# Patient Record
Sex: Male | Born: 1940 | Race: White | Hispanic: No | Marital: Married | Smoking: Former smoker
Health system: Southern US, Community
[De-identification: ages and names within clinical notes are randomized; demographics above are authoritative.]

## PROBLEM LIST (undated history)

## (undated) DIAGNOSIS — I639 Cerebral infarction, unspecified: Secondary | ICD-10-CM

## (undated) DIAGNOSIS — C61 Malignant neoplasm of prostate: Secondary | ICD-10-CM

## (undated) DIAGNOSIS — I10 Essential (primary) hypertension: Secondary | ICD-10-CM

## (undated) DIAGNOSIS — C169 Malignant neoplasm of stomach, unspecified: Secondary | ICD-10-CM

## (undated) DIAGNOSIS — C4490 Unspecified malignant neoplasm of skin, unspecified: Secondary | ICD-10-CM

## (undated) HISTORY — DX: Cerebral infarction, unspecified: I63.9

## (undated) HISTORY — PX: VASCULAR SURGERY: SHX849

## (undated) HISTORY — DX: Malignant neoplasm of stomach, unspecified: C16.9

## (undated) HISTORY — DX: Malignant neoplasm of prostate: C61

## (undated) HISTORY — DX: Unspecified malignant neoplasm of skin, unspecified: C44.90

---

## 2004-10-30 ENCOUNTER — Ambulatory Visit: Payer: Self-pay | Admitting: Surgery

## 2005-04-15 ENCOUNTER — Other Ambulatory Visit: Payer: Self-pay

## 2005-04-15 ENCOUNTER — Emergency Department: Payer: Self-pay | Admitting: Emergency Medicine

## 2007-01-12 ENCOUNTER — Ambulatory Visit: Payer: Self-pay | Admitting: Surgery

## 2007-03-24 ENCOUNTER — Ambulatory Visit: Payer: Self-pay | Admitting: Vascular Surgery

## 2007-04-14 ENCOUNTER — Ambulatory Visit: Payer: Self-pay | Admitting: Vascular Surgery

## 2011-04-15 ENCOUNTER — Emergency Department: Payer: Self-pay | Admitting: Emergency Medicine

## 2011-04-17 ENCOUNTER — Ambulatory Visit: Payer: Self-pay | Admitting: Family Medicine

## 2011-09-27 ENCOUNTER — Ambulatory Visit: Payer: Self-pay | Admitting: Gastroenterology

## 2011-12-03 ENCOUNTER — Ambulatory Visit: Payer: Self-pay | Admitting: Unknown Physician Specialty

## 2016-04-08 ENCOUNTER — Other Ambulatory Visit: Payer: Self-pay | Admitting: Vascular Surgery

## 2016-04-08 DIAGNOSIS — R1901 Right upper quadrant abdominal swelling, mass and lump: Secondary | ICD-10-CM

## 2016-04-15 ENCOUNTER — Ambulatory Visit
Admission: RE | Admit: 2016-04-15 | Discharge: 2016-04-15 | Disposition: A | Discharge status: Home / Self Care | Payer: Medicare Other | Source: Ambulatory Visit | Attending: Vascular Surgery | Admitting: Vascular Surgery

## 2016-04-15 DIAGNOSIS — J439 Emphysema, unspecified: Secondary | ICD-10-CM | POA: Diagnosis not present

## 2016-04-15 DIAGNOSIS — R160 Hepatomegaly, not elsewhere classified: Secondary | ICD-10-CM | POA: Diagnosis not present

## 2016-04-15 DIAGNOSIS — K7689 Other specified diseases of liver: Secondary | ICD-10-CM | POA: Insufficient documentation

## 2016-04-15 DIAGNOSIS — R1901 Right upper quadrant abdominal swelling, mass and lump: Secondary | ICD-10-CM | POA: Diagnosis present

## 2016-04-15 DIAGNOSIS — C7971 Secondary malignant neoplasm of right adrenal gland: Secondary | ICD-10-CM | POA: Insufficient documentation

## 2016-04-15 DIAGNOSIS — C7972 Secondary malignant neoplasm of left adrenal gland: Secondary | ICD-10-CM | POA: Diagnosis not present

## 2016-04-15 HISTORY — DX: Essential (primary) hypertension: I10

## 2016-04-15 LAB — POCT I-STAT CREATININE: Creatinine, Ser: 1.1 mg/dL (ref 0.61–1.24)

## 2016-04-15 MED ORDER — IOPAMIDOL (ISOVUE-300) INJECTION 61%
100.0000 mL | Freq: Once | INTRAVENOUS | Status: AC | PRN
  Administered 2016-04-15: 100 mL via INTRAVENOUS

## 2016-04-22 DIAGNOSIS — R160 Hepatomegaly, not elsewhere classified: Secondary | ICD-10-CM | POA: Insufficient documentation

## 2016-04-23 ENCOUNTER — Inpatient Hospital Stay: Payer: Medicare Other | Attending: Oncology | Admitting: Oncology

## 2016-04-23 ENCOUNTER — Encounter (INDEPENDENT_AMBULATORY_CARE_PROVIDER_SITE_OTHER): Payer: Self-pay

## 2016-04-23 ENCOUNTER — Inpatient Hospital Stay: Payer: Medicare Other

## 2016-04-23 ENCOUNTER — Encounter: Payer: Self-pay | Admitting: Oncology

## 2016-04-23 ENCOUNTER — Inpatient Hospital Stay: Payer: Medicare Other | Admitting: Oncology

## 2016-04-23 VITALS — BP 145/71 | HR 65 | Temp 97.1°F | Resp 18 | Ht 68.0 in | Wt 150.6 lb

## 2016-04-23 DIAGNOSIS — I10 Essential (primary) hypertension: Secondary | ICD-10-CM | POA: Insufficient documentation

## 2016-04-23 DIAGNOSIS — R160 Hepatomegaly, not elsewhere classified: Secondary | ICD-10-CM | POA: Diagnosis not present

## 2016-04-23 DIAGNOSIS — Z79899 Other long term (current) drug therapy: Secondary | ICD-10-CM | POA: Insufficient documentation

## 2016-04-23 DIAGNOSIS — Z8546 Personal history of malignant neoplasm of prostate: Secondary | ICD-10-CM | POA: Diagnosis not present

## 2016-04-23 DIAGNOSIS — R978 Other abnormal tumor markers: Secondary | ICD-10-CM | POA: Diagnosis not present

## 2016-04-23 LAB — CBC WITH DIFFERENTIAL/PLATELET
Basophils Absolute: 0.1 10*3/uL (ref 0–0.1)
Basophils Relative: 1 %
EOS PCT: 3 %
Eosinophils Absolute: 0.3 10*3/uL (ref 0–0.7)
HCT: 35.9 % — ABNORMAL LOW (ref 40.0–52.0)
HEMOGLOBIN: 12.2 g/dL — AB (ref 13.0–18.0)
LYMPHS ABS: 1.9 10*3/uL (ref 1.0–3.6)
LYMPHS PCT: 19 %
MCH: 27.3 pg (ref 26.0–34.0)
MCHC: 34 g/dL (ref 32.0–36.0)
MCV: 80.3 fL (ref 80.0–100.0)
Monocytes Absolute: 0.8 10*3/uL (ref 0.2–1.0)
Monocytes Relative: 8 %
NEUTROS PCT: 69 %
Neutro Abs: 6.9 10*3/uL — ABNORMAL HIGH (ref 1.4–6.5)
Platelets: 507 10*3/uL — ABNORMAL HIGH (ref 150–440)
RBC: 4.47 MIL/uL (ref 4.40–5.90)
RDW: 14.4 % (ref 11.5–14.5)
WBC: 10.1 10*3/uL (ref 3.8–10.6)

## 2016-04-23 LAB — COMPREHENSIVE METABOLIC PANEL
ALK PHOS: 288 U/L — AB (ref 38–126)
ALT: 16 U/L — AB (ref 17–63)
AST: 32 U/L (ref 15–41)
Albumin: 3.5 g/dL (ref 3.5–5.0)
Anion gap: 8 (ref 5–15)
BUN: 17 mg/dL (ref 6–20)
CALCIUM: 11.4 mg/dL — AB (ref 8.9–10.3)
CO2: 25 mmol/L (ref 22–32)
CREATININE: 1.29 mg/dL — AB (ref 0.61–1.24)
Chloride: 102 mmol/L (ref 101–111)
GFR, EST NON AFRICAN AMERICAN: 53 mL/min — AB (ref >60)
Glucose, Bld: 105 mg/dL — ABNORMAL HIGH (ref 65–99)
Potassium: 4.7 mmol/L (ref 3.5–5.1)
Sodium: 135 mmol/L (ref 135–145)
TOTAL PROTEIN: 8.2 g/dL — AB (ref 6.5–8.1)
Total Bilirubin: 0.4 mg/dL (ref 0.3–1.2)

## 2016-04-23 LAB — PSA: PSA: 3.22 ng/mL (ref 0.00–4.00)

## 2016-04-23 LAB — PROTIME-INR
INR: 1.04
PROTHROMBIN TIME: 13.6 s (ref 11.4–15.2)

## 2016-04-23 NOTE — Progress Notes (Signed)
  Oncology Nurse Navigator Documentation  Navigator Location: [facility] Onc (04/23/16 1600) Navigator Encounter Type: Initial MedOnc (04/23/16 1600)             Treatment Phase: Abnormal Scans (04/23/16 1600)                            Time Spent with Patient: 30 (04/23/16 1600)   Introduced nurse navigator services during consult with Dr Grayland Ormond. Provided contact information for any future questions or concerns. Escorted to lab for today's labs. Explained tumor marker and purpose of.

## 2016-04-23 NOTE — Progress Notes (Signed)
Patient is here for follow up  

## 2016-04-24 LAB — CEA: CEA: 3.7 ng/mL (ref 0.0–4.7)

## 2016-04-24 LAB — AFP TUMOR MARKER: AFP TUMOR MARKER: 152.1 ng/mL — AB (ref 0.0–8.3)

## 2016-04-24 LAB — CANCER ANTIGEN 19-9: CA 19 9: 68 U/mL — AB (ref 0–35)

## 2016-04-24 NOTE — Progress Notes (Signed)
[place]  [phone] [phone]  ID: Jonathon Benjamin OB: October 28, 1940  MR#: [id number]  [id number]  Patient Care Team: Sherrin Daisy, MD as PCP - General (Family Medicine) Clent Jacks, RN as Registered Nurse  CHIEF COMPLAINT: Liver masses  INTERVAL HISTORY: Patient is 75 year old male who is undergoing ultrasound to evaluate his aorta by vascular surgery and incidentally noted to have multiple liver masses. Subsequent CT scan revealed a 14 cm mass as well as a 7 cm mass highly suspicious for underlying malignancy. Currently, patient feels well and is asymptomatic. He denies any pain. He has no neurologic complaints. He denies any recent fevers or illnesses. He has a good appetite and denies weight loss. He has no chest pain or shortness of breath. He denies any nausea, vomiting, constipation, or diarrhea. He has no melena or hematochezia. He has no urinary complaints. Patient offers no specific complaints today.  REVIEW OF SYSTEMS:   Review of Systems  Constitutional: Negative.  Negative for fever, malaise/fatigue and weight loss.  Respiratory: Negative.  Negative for cough and shortness of breath.   Cardiovascular: Negative.  Negative for chest pain and leg swelling.  Gastrointestinal: Negative.  Negative for abdominal pain, blood in stool, constipation, diarrhea, melena, nausea and vomiting.  Genitourinary: Negative.   Neurological: Negative.  Negative for weakness.  Psychiatric/Behavioral: Negative.     As per HPI. Otherwise, a complete review of systems is negatve.  PAST MEDICAL HISTORY: Past Medical History:  Diagnosis Date  . Hypertension   . Prostate cancer ([place])   . Skin cancer   . Stomach cancer ([place])   . Stroke [facility])     PAST SURGICAL HISTORY: No past surgical history on file.  FAMILY HISTORY: Family History  Problem Relation Age of Onset  . Cancer Maternal Aunt   . Heart attack Maternal Grandfather         ADVANCED DIRECTIVES (Y/N):  N   HEALTH MAINTENANCE: Social History  Substance Use Topics  . Smoking status: Not on file  . Smokeless tobacco: Not on file  . Alcohol use Not on file     Colonoscopy:  PAP:  Bone density:  Lipid panel:  No Known Allergies  Current Outpatient Prescriptions  Medication Sig Dispense Refill  . amLODipine (NORVASC) 5 MG tablet Take 5 mg by mouth daily.     Marland Kitchen aspirin EC 81 MG tablet Take 81 mg by mouth daily.     . benazepril (LOTENSIN) 20 MG tablet Take 20 mg by mouth daily.     . Loratadine 10 MG CAPS Take 10 mg by mouth.     . simvastatin (ZOCOR) 20 MG tablet Take 20 mg by mouth daily.     . citalopram (CELEXA) 20 MG tablet Take by mouth.     No current facility-administered medications for this visit.     OBJECTIVE: Vitals:   04/23/16 1525  BP: (!) 145/71  Pulse: 65  Resp: 18  Temp: 97.1 F (36.2 C)     Body mass index is 22.89 kg/m.    ECOG FS:0 - Asymptomatic  General: Well-developed, well-nourished, no acute distress. Eyes: Pink conjunctiva, anicteric sclera. HEENT: Normocephalic, moist mucous membranes, clear oropharnyx. Lungs: Clear to auscultation bilaterally. Heart: Regular rate and rhythm. No rubs, murmurs, or gallops. Abdomen: Soft, nontender, mildly distended, palpable mass noted in left lobe of liver.  Musculoskeletal: No edema, cyanosis, or clubbing. Neuro: Alert, answering all questions appropriately. Cranial nerves grossly intact. Skin: No rashes or petechiae noted.  Psych: Normal affect. Lymphatics: No cervical, calvicular, axillary or inguinal LAD.   LAB RESULTS:  Lab Results  Component Value Date   NA 135 04/23/2016   K 4.7 04/23/2016   CL 102 04/23/2016   CO2 25 04/23/2016   GLUCOSE 105 (H) 04/23/2016   BUN 17 04/23/2016   CREATININE 1.29 (H) 04/23/2016   CALCIUM 11.4 (H) 04/23/2016   PROT 8.2 (H) 04/23/2016   ALBUMIN 3.5 04/23/2016   AST 32 04/23/2016   ALT 16 (L) 04/23/2016   ALKPHOS 288  (H) 04/23/2016   BILITOT 0.4 04/23/2016   GFRNONAA 53 (L) 04/23/2016   GFRAA >60 04/23/2016    Lab Results  Component Value Date   WBC 10.1 04/23/2016   NEUTROABS 6.9 (H) 04/23/2016   HGB 12.2 (L) 04/23/2016   HCT 35.9 (L) 04/23/2016   MCV 80.3 04/23/2016   PLT 507 (H) 04/23/2016    STUDIES: Ct Abdomen Pelvis W Contrast  Result Date: 04/15/2016 CLINICAL DATA:  Right upper quadrant mass. EXAM: CT ABDOMEN AND PELVIS WITH CONTRAST TECHNIQUE: Multidetector CT imaging of the abdomen and pelvis was performed using the standard protocol following bolus administration of intravenous contrast. CONTRAST:  129mL ISOVUE-300 IOPAMIDOL (ISOVUE-300) INJECTION 61% COMPARISON:  CTA 01/12/2007 FINDINGS: Lower chest and abdominal wall: Mild fatty enlargement of the left inguinal canal. Emphysematous changes seen in the basilar lungs. No visible nodule on scout image covering the lower chest. Hepatobiliary: There is a large mass in the lower central liver measuring at least 14 cm, invading the gallbladder. There is also a 7 cm segment 7/8 mass with the same heterogeneous hypo enhancement. 7 mm segment 2 lesion also noted. No convincing underlying cirrhosis. No porta hepatis adenopathy or evidence of tumor thrombus. Hilar mass effect causes mild intrahepatic bile duct dilatation Pancreas: Unremarkable. Spleen: Unremarkable. 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 Bilateral renal cysts. Smooth right renal atrophy, likely arterial compromise given the pattern. Left renal calculi including 6 mm stone in the urinary pelvis. No hydronephrosis or ureteral calculus . Bladder wall thickening and small left-sided diverticulum. Presumed chronic outlet obstruction Stomach/Bowel:  No primary mass is seen. No obstruction. Reproductive:Enlarged prostate with large central nodular component projecting into the bladder base. Vascular/Lymphatic: Diffuse advanced  atherosclerotic disease. Right renal artery ostial stenosis, likely accounting for asymmetric atrophy. No adenopathy noted. No mass or adenopathy. Other: No ascites or pneumoperitoneum. Musculoskeletal: No visible metastasis. These results will be called to the ordering clinician or representative by the Radiologist Assistant, and communication documented in the PACS or zVision Dashboard. IMPRESSION: 1. 14 cm liver mass correlating with physical exam finding, invading the gallbladder. This could be a primary malignancy or metastasis. There is also a 7 cm right liver mass, 7 mm segment 2 liver nodule, and bilateral adrenal metastases. No extrahepatic primary identified. 2.  Emphysema. (ICD10-J43.9) 3. Incidental findings noted above. Electronically Signed   By: Monte Fantasia M.D.   On: 04/15/2016 10:29    ASSESSMENT: Liver masses  PLAN:    1. Liver masses: Highly suspicious for underlying malignancy. CT scan results from April 15, 2016 reviewed independently and reported as above.  Patient has a significantly elevated AFP at 152 and a mildly elevated CA-19-9 at 68, but his CEA and PSA are within normal limits. We will get ultrasound guided biopsy in the next 1-2 weeks. Patient will then follow-up in 2-3 days after biopsy to discuss the results and treatment planning.  Approximately 45 minutes  was spent in discussion of which greater than 50% was consultation.  Patient expressed understanding and was in agreement with this plan. He also understands that He can call clinic at any time with any questions, concerns, or complaints.   No matching staging information was found for the patient.  Lloyd Huger, MD   04/24/2016 10:34 PM

## 2016-04-25 ENCOUNTER — Telehealth: Payer: Self-pay

## 2016-04-25 NOTE — Telephone Encounter (Signed)
  Oncology Nurse Navigator Documentation  Navigator Location: [facility] (04/25/16 1400) Navigator Encounter Type: Telephone (04/25/16 1400) Telephone: Education (04/25/16 1400)                                        Time Spent with Patient: 15 (04/25/16 1400)   Received call from Mr. Ramm. Answered some questions related to liver cancer and treatments. Went back over biopsy process with him. Emotional support given.

## 2016-05-01 ENCOUNTER — Other Ambulatory Visit: Payer: Self-pay | Admitting: General Surgery

## 2016-05-07 ENCOUNTER — Ambulatory Visit
Admission: RE | Admit: 2016-05-07 | Discharge: 2016-05-07 | Disposition: A | Discharge status: Home / Self Care | Payer: Medicare Other | Source: Ambulatory Visit | Attending: Oncology | Admitting: Oncology

## 2016-05-07 DIAGNOSIS — R160 Hepatomegaly, not elsewhere classified: Secondary | ICD-10-CM | POA: Insufficient documentation

## 2016-05-07 DIAGNOSIS — Z85828 Personal history of other malignant neoplasm of skin: Secondary | ICD-10-CM | POA: Insufficient documentation

## 2016-05-07 DIAGNOSIS — Z8546 Personal history of malignant neoplasm of prostate: Secondary | ICD-10-CM | POA: Diagnosis not present

## 2016-05-07 DIAGNOSIS — Z8673 Personal history of transient ischemic attack (TIA), and cerebral infarction without residual deficits: Secondary | ICD-10-CM | POA: Diagnosis not present

## 2016-05-07 DIAGNOSIS — Z9889 Other specified postprocedural states: Secondary | ICD-10-CM | POA: Insufficient documentation

## 2016-05-07 DIAGNOSIS — C7A1 Malignant poorly differentiated neuroendocrine tumors: Secondary | ICD-10-CM | POA: Diagnosis not present

## 2016-05-07 DIAGNOSIS — Z79899 Other long term (current) drug therapy: Secondary | ICD-10-CM | POA: Diagnosis not present

## 2016-05-07 DIAGNOSIS — Z85028 Personal history of other malignant neoplasm of stomach: Secondary | ICD-10-CM | POA: Insufficient documentation

## 2016-05-07 DIAGNOSIS — Z87891 Personal history of nicotine dependence: Secondary | ICD-10-CM | POA: Insufficient documentation

## 2016-05-07 DIAGNOSIS — I10 Essential (primary) hypertension: Secondary | ICD-10-CM | POA: Insufficient documentation

## 2016-05-07 DIAGNOSIS — Z7982 Long term (current) use of aspirin: Secondary | ICD-10-CM | POA: Insufficient documentation

## 2016-05-07 LAB — CBC
HEMATOCRIT: 35.5 % — AB (ref 40.0–52.0)
HEMOGLOBIN: 12.2 g/dL — AB (ref 13.0–18.0)
MCH: 27.7 pg (ref 26.0–34.0)
MCHC: 34.3 g/dL (ref 32.0–36.0)
MCV: 80.8 fL (ref 80.0–100.0)
Platelets: 510 10*3/uL — ABNORMAL HIGH (ref 150–440)
RBC: 4.39 MIL/uL — ABNORMAL LOW (ref 4.40–5.90)
RDW: 14.7 % — ABNORMAL HIGH (ref 11.5–14.5)
WBC: 9.2 10*3/uL (ref 3.8–10.6)

## 2016-05-07 LAB — PROTIME-INR
INR: 1.05
Prothrombin Time: 13.7 seconds (ref 11.4–15.2)

## 2016-05-07 LAB — APTT: aPTT: 33 seconds (ref 24–36)

## 2016-05-07 MED ORDER — FENTANYL CITRATE (PF) 100 MCG/2ML IJ SOLN
INTRAMUSCULAR | Status: AC
  Filled 2016-05-07: qty 4

## 2016-05-07 MED ORDER — MIDAZOLAM HCL 5 MG/5ML IJ SOLN
INTRAMUSCULAR | Status: AC
  Filled 2016-05-07: qty 5

## 2016-05-07 MED ORDER — SODIUM CHLORIDE 0.9 % IV SOLN
INTRAVENOUS | Status: DC
  Administered 2016-05-07: 10:00:00 via INTRAVENOUS

## 2016-05-07 MED ORDER — MIDAZOLAM HCL 5 MG/5ML IJ SOLN
INTRAMUSCULAR | Status: AC | PRN
  Administered 2016-05-07 (×2): 1 mg via INTRAVENOUS

## 2016-05-07 MED ORDER — FENTANYL CITRATE (PF) 100 MCG/2ML IJ SOLN
INTRAMUSCULAR | Status: AC | PRN
  Administered 2016-05-07: 25 ug via INTRAVENOUS
  Administered 2016-05-07: 50 ug via INTRAVENOUS

## 2016-05-07 NOTE — H&P (Signed)
Chief Complaint: Patient was seen in consultation today for No chief complaint on file.  at the request of Finnegan,Timothy J  Referring Physician(s): Finnegan,Timothy J  Supervising Physician: Marybelle Killings  Patient Status: Outpatient  History of Present Illness: Jonathon Benjamin is a 75 y.o. male who was found to have an abdominal mass by physical during PVD evaluation. CT revealed liver masses. AFP and CA 19-9 elevated.  Past Medical History:  Diagnosis Date  . Hypertension   . Prostate cancer ([place])   . Skin cancer   . Stomach cancer ([place])   . Stroke [facility])     Past Surgical History:  Procedure Laterality Date  . VASCULAR SURGERY      Allergies: Review of patient's allergies indicates no known allergies.  Medications: Prior to Admission medications   Medication Sig Start Date End Date Taking? Authorizing Provider  amLODipine (NORVASC) 5 MG tablet Take 5 mg by mouth daily.  04/17/16  Yes Historical Provider, MD  aspirin EC 81 MG tablet Take 81 mg by mouth daily.    Yes Historical Provider, MD  benazepril (LOTENSIN) 20 MG tablet Take 20 mg by mouth daily.  04/17/16  Yes Historical Provider, MD  citalopram (CELEXA) 20 MG tablet Take by mouth. 04/17/16  Yes Historical Provider, MD  simvastatin (ZOCOR) 20 MG tablet Take 20 mg by mouth daily.  04/17/16  Yes Historical Provider, MD  Loratadine 10 MG CAPS Take 10 mg by mouth.  02/02/16   Historical Provider, MD     Family History  Problem Relation Age of Onset  . Cancer Maternal Aunt   . Heart attack Maternal Grandfather     Social History   Social History  . Marital status: Married    Spouse name: N/A  . Number of children: N/A  . Years of education: N/A   Social History Main Topics  . Smoking status: Former Smoker    Quit date: 05/07/2008  . Smokeless tobacco: Not on file  . Alcohol use No  . Drug use: No  . Sexual activity: Not on file   Other Topics Concern  . Not on file   Social History Narrative  .  No narrative on file     Review of Systems: A 12 point ROS discussed and pertinent positives are indicated in the HPI above.  All other systems are negative.  Review of Systems  Vital Signs: BP 106/63   Pulse 61   Temp 97.7 F (36.5 C) (Oral)   Resp (!) 22   Ht 5\' 6"  (1.676 m)   Wt 150 lb (68 kg)   SpO2 97%   BMI 24.21 kg/m   Physical Exam  Constitutional: He is oriented to person, place, and time. He appears well-developed and well-nourished.  Cardiovascular: Normal rate and regular rhythm.   Pulmonary/Chest: Effort normal and breath sounds normal.  Abdominal: He exhibits mass. There is no tenderness.  Neurological: He is alert and oriented to person, place, and time.    Mallampati Score:     Imaging: Ct Abdomen Pelvis W Contrast  Result Date: 04/15/2016 CLINICAL DATA:  Right upper quadrant mass. EXAM: CT ABDOMEN AND PELVIS WITH CONTRAST TECHNIQUE: Multidetector CT imaging of the abdomen and pelvis was performed using the standard protocol following bolus administration of intravenous contrast. CONTRAST:  146mL ISOVUE-300 IOPAMIDOL (ISOVUE-300) INJECTION 61% COMPARISON:  CTA 01/12/2007 FINDINGS: Lower chest and abdominal wall: Mild fatty enlargement of the left inguinal canal. Emphysematous changes seen in the basilar lungs. No  visible nodule on scout image covering the lower chest. Hepatobiliary: There is a large mass in the lower central liver measuring at least 14 cm, invading the gallbladder. There is also a 7 cm segment 7/8 mass with the same heterogeneous hypo enhancement. 7 mm segment 2 lesion also noted. No convincing underlying cirrhosis. No porta hepatis adenopathy or evidence of tumor thrombus. Hilar mass effect causes mild intrahepatic bile duct dilatation Pancreas: Unremarkable. Spleen: Unremarkable. 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 Bilateral renal cysts. Smooth right renal atrophy, likely  arterial compromise given the pattern. Left renal calculi including 6 mm stone in the urinary pelvis. No hydronephrosis or ureteral calculus . Bladder wall thickening and small left-sided diverticulum. Presumed chronic outlet obstruction Stomach/Bowel:  No primary mass is seen. No obstruction. Reproductive:Enlarged prostate with large central nodular component projecting into the bladder base. Vascular/Lymphatic: Diffuse advanced atherosclerotic disease. Right renal artery ostial stenosis, likely accounting for asymmetric atrophy. No adenopathy noted. No mass or adenopathy. Other: No ascites or pneumoperitoneum. Musculoskeletal: No visible metastasis. These results will be called to the ordering clinician or representative by the Radiologist Assistant, and communication documented in the PACS or zVision Dashboard. IMPRESSION: 1. 14 cm liver mass correlating with physical exam finding, invading the gallbladder. This could be a primary malignancy or metastasis. There is also a 7 cm right liver mass, 7 mm segment 2 liver nodule, and bilateral adrenal metastases. No extrahepatic primary identified. 2.  Emphysema. (ICD10-J43.9) 3. Incidental findings noted above. Electronically Signed   By: Monte Fantasia M.D.   On: 04/15/2016 10:29    Labs:  CBC:  Recent Labs  04/23/16 1552 05/07/16 0944  WBC 10.1 9.2  HGB 12.2* 12.2*  HCT 35.9* 35.5*  PLT 507* 510*    COAGS:  Recent Labs  04/23/16 1552 05/07/16 0944  INR 1.04 1.05  APTT  --  33    BMP:  Recent Labs  04/15/16 0939 04/23/16 1552  NA  --  135  K  --  4.7  CL  --  102  CO2  --  25  GLUCOSE  --  105*  BUN  --  17  CALCIUM  --  11.4*  CREATININE 1.10 1.29*  GFRNONAA  --  53*  GFRAA  --  >60    LIVER FUNCTION TESTS:  Recent Labs  04/23/16 1552  BILITOT 0.4  AST 32  ALT 16*  ALKPHOS 288*  PROT 8.2*  ALBUMIN 3.5    TUMOR MARKERS:  Recent Labs  04/23/16 1552  AFPTM 152.1*  CEA 3.7  CA199 68*    Assessment and  Plan:  Liver masses. Biopsy to follow.  Thank you for this interesting consult.  I greatly enjoyed meeting Jonathon Benjamin and look forward to participating in their care.  A copy of this report was sent to the requesting provider on this date.  Electronically Signed: Ashvin Adelson, ART A 05/07/2016, 10:41 AM   I spent a total of  40 Minutes   in face to face in clinical consultation, greater than 50% of which was counseling/coordinating care for liver boipsy.

## 2016-05-07 NOTE — Procedures (Signed)
Liver mass Bx 18g times three No comp/EBL

## 2016-05-08 NOTE — Progress Notes (Deleted)
[place]  [phone] [phone]  ID: [id number] KUPER OB: [birth year]  MR#: [id number]  [id number]  Patient Care Team: Sherrin Daisy, MD as PCP - General (Family Medicine) Clent Jacks, RN as Registered Nurse  CHIEF COMPLAINT: Liver masses  INTERVAL HISTORY: Patient is 75 year old male who is undergoing ultrasound to evaluate his aorta by vascular surgery and incidentally noted to have multiple liver masses. Subsequent CT scan revealed a 14 cm mass as well as a 7 cm mass highly suspicious for underlying malignancy. Currently, patient feels well and is asymptomatic. He denies any pain. He has no neurologic complaints. He denies any recent fevers or illnesses. He has a good appetite and denies weight loss. He has no chest pain or shortness of breath. He denies any nausea, vomiting, constipation, or diarrhea. He has no melena or hematochezia. He has no urinary complaints. Patient offers no specific complaints today.  REVIEW OF SYSTEMS:   Review of Systems  Constitutional: Negative.  Negative for fever, malaise/fatigue and weight loss.  Respiratory: Negative.  Negative for cough and shortness of breath.   Cardiovascular: Negative.  Negative for chest pain and leg swelling.  Gastrointestinal: Negative.  Negative for abdominal pain, blood in stool, constipation, diarrhea, melena, nausea and vomiting.  Genitourinary: Negative.   Neurological: Negative.  Negative for weakness.  Psychiatric/Behavioral: Negative.     As per HPI. Otherwise, a complete review of systems is negatve.  PAST MEDICAL HISTORY: Past Medical History:  Diagnosis Date  . Hypertension   . Prostate cancer ([place])   . Skin cancer   . Stomach cancer ([place])   . Stroke [facility])     PAST SURGICAL HISTORY: Past Surgical History:  Procedure Laterality Date  . VASCULAR SURGERY      FAMILY HISTORY: Family History  Problem Relation Age of Onset  . Cancer Maternal Aunt   .  Heart attack Maternal Grandfather        ADVANCED DIRECTIVES (Y/N):  N   HEALTH MAINTENANCE: Social History  Substance Use Topics  . Smoking status: Former Smoker    Quit date: 05/07/2008  . Smokeless tobacco: Not on file  . Alcohol use No     Colonoscopy:  PAP:  Bone density:  Lipid panel:  No Known Allergies  Current Outpatient Prescriptions  Medication Sig Dispense Refill  . amLODipine (NORVASC) 5 MG tablet Take 5 mg by mouth daily.     Marland Kitchen aspirin EC 81 MG tablet Take 81 mg by mouth daily.     . benazepril (LOTENSIN) 20 MG tablet Take 20 mg by mouth daily.     . citalopram (CELEXA) 20 MG tablet Take by mouth.    . Loratadine 10 MG CAPS Take 10 mg by mouth.     . simvastatin (ZOCOR) 20 MG tablet Take 20 mg by mouth daily.      No current facility-administered medications for this visit.     OBJECTIVE: There were no vitals filed for this visit.   There is no height or weight on file to calculate BMI.    ECOG FS:0 - Asymptomatic  General: Well-developed, well-nourished, no acute distress. Eyes: Pink conjunctiva, anicteric sclera. HEENT: Normocephalic, moist mucous membranes, clear oropharnyx. Lungs: Clear to auscultation bilaterally. Heart: Regular rate and rhythm. No rubs, murmurs, or gallops. Abdomen: Soft, nontender, mildly distended, palpable mass noted in left lobe of liver.  Musculoskeletal: No edema, cyanosis, or clubbing. Neuro: Alert, answering all questions appropriately. Cranial nerves grossly intact. Skin: No rashes or  petechiae noted. Psych: Normal affect. Lymphatics: No cervical, calvicular, axillary or inguinal LAD.   LAB RESULTS:  Lab Results  Component Value Date   NA 135 04/23/2016   K 4.7 04/23/2016   CL 102 04/23/2016   CO2 25 04/23/2016   GLUCOSE 105 (H) 04/23/2016   BUN 17 04/23/2016   CREATININE 1.29 (H) 04/23/2016   CALCIUM 11.4 (H) 04/23/2016   PROT 8.2 (H) 04/23/2016   ALBUMIN 3.5 04/23/2016   AST 32 04/23/2016   ALT 16 (L)  04/23/2016   ALKPHOS 288 (H) 04/23/2016   BILITOT 0.4 04/23/2016   GFRNONAA 53 (L) 04/23/2016   GFRAA >60 04/23/2016    Lab Results  Component Value Date   WBC 9.2 05/07/2016   NEUTROABS 6.9 (H) 04/23/2016   HGB 12.2 (L) 05/07/2016   HCT 35.5 (L) 05/07/2016   MCV 80.8 05/07/2016   PLT 510 (H) 05/07/2016    STUDIES: Ct Abdomen Pelvis W Contrast  Result Date: 04/15/2016 CLINICAL DATA:  Right upper quadrant mass. EXAM: CT ABDOMEN AND PELVIS WITH CONTRAST TECHNIQUE: Multidetector CT imaging of the abdomen and pelvis was performed using the standard protocol following bolus administration of intravenous contrast. CONTRAST:  158mL ISOVUE-300 IOPAMIDOL (ISOVUE-300) INJECTION 61% COMPARISON:  CTA 01/12/2007 FINDINGS: Lower chest and abdominal wall: Mild fatty enlargement of the left inguinal canal. Emphysematous changes seen in the basilar lungs. No visible nodule on scout image covering the lower chest. Hepatobiliary: There is a large mass in the lower central liver measuring at least 14 cm, invading the gallbladder. There is also a 7 cm segment 7/8 mass with the same heterogeneous hypo enhancement. 7 mm segment 2 lesion also noted. No convincing underlying cirrhosis. No porta hepatis adenopathy or evidence of tumor thrombus. Hilar mass effect causes mild intrahepatic bile duct dilatation Pancreas: Unremarkable. Spleen: Unremarkable. 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 Bilateral renal cysts. Smooth right renal atrophy, likely arterial compromise given the pattern. Left renal calculi including 6 mm stone in the urinary pelvis. No hydronephrosis or ureteral calculus . Bladder wall thickening and small left-sided diverticulum. Presumed chronic outlet obstruction Stomach/Bowel:  No primary mass is seen. No obstruction. Reproductive:Enlarged prostate with large central nodular component projecting into the bladder base.  Vascular/Lymphatic: Diffuse advanced atherosclerotic disease. Right renal artery ostial stenosis, likely accounting for asymmetric atrophy. No adenopathy noted. No mass or adenopathy. Other: No ascites or pneumoperitoneum. Musculoskeletal: No visible metastasis. These results will be called to the ordering clinician or representative by the Radiologist Assistant, and communication documented in the PACS or zVision Dashboard. IMPRESSION: 1. 14 cm liver mass correlating with physical exam finding, invading the gallbladder. This could be a primary malignancy or metastasis. There is also a 7 cm right liver mass, 7 mm segment 2 liver nodule, and bilateral adrenal metastases. No extrahepatic primary identified. 2.  Emphysema. (ICD10-J43.9) 3. Incidental findings noted above. Electronically Signed   By: Monte Fantasia M.D.   On: 04/15/2016 10:29   US Biopsy  Result Date: 05/07/2016 INDICATION: Liver lesions EXAM: ULTRASOUND-GUIDED BIOPSY OF A LARGE LIVER MASS. MEDICATIONS: None. ANESTHESIA/SEDATION: Fentanyl 50 mcg IV; Versed 1 mg IV Moderate Sedation Time:  10 minutes The patient was continuously monitored during the procedure by the interventional radiology nurse under my direct supervision. FLUOROSCOPY TIME:  Fluoroscopy Time:  minutes  seconds ( mGy). COMPLICATIONS: None immediate. PROCEDURE: Informed written consent was obtained from the patient after a thorough discussion of the procedural risks, benefits and  alternatives. All questions were addressed. Maximal Sterile Barrier Technique was utilized including caps, mask, sterile gowns, sterile gloves, sterile drape, hand hygiene and skin antiseptic. A timeout was performed prior to the initiation of the procedure. The epigastrium was prepped with ChloraPrep in a sterile fashion, and a sterile drape was applied covering the operative field. A sterile gown and sterile gloves were used for the procedure. Under sonographic guidance, an 17 gauge guide needle was  advanced into the large liver mass. Subsequently 3 18 gauge core biopsies were obtained. Gel-Foam slurry was injected into the needle tract. The guide needle was removed. Final imaging was performed. Patient tolerated the procedure well without complication. Vital sign monitoring by nursing staff during the procedure will continue as patient is in the special procedures unit for post procedure observation. FINDINGS: The images document guide needle placement within the large liver mass. Post biopsy images demonstrate no hemorrhage. IMPRESSION: Successful ultrasound-guided core biopsy of a large liver mass. Electronically Signed   By: Marybelle Killings M.D.   On: 05/07/2016 12:11    ASSESSMENT: Liver masses  PLAN:    1. Liver masses: Highly suspicious for underlying malignancy. CT scan results from April 15, 2016 reviewed independently and reported as above.  Patient has a significantly elevated AFP at 152 and a mildly elevated CA-19-9 at 68, but his CEA and PSA are within normal limits. We will get ultrasound guided biopsy in the next 1-2 weeks. Patient will then follow-up in 2-3 days after biopsy to discuss the results and treatment planning.  Approximately 45 minutes was spent in discussion of which greater than 50% was consultation.  Patient expressed understanding and was in agreement with this plan. He also understands that He can call clinic at any time with any questions, concerns, or complaints.   No matching staging information was found for the patient.  Lloyd Huger, MD   05/08/2016 11:09 PM

## 2016-05-09 ENCOUNTER — Other Ambulatory Visit: Payer: Self-pay | Admitting: *Deleted

## 2016-05-09 ENCOUNTER — Inpatient Hospital Stay: Payer: Medicare Other | Admitting: Oncology

## 2016-05-09 ENCOUNTER — Telehealth: Payer: Self-pay

## 2016-05-09 DIAGNOSIS — R160 Hepatomegaly, not elsewhere classified: Secondary | ICD-10-CM

## 2016-05-09 MED ORDER — ONDANSETRON HCL 8 MG PO TABS: 8.0000 mg | ORAL_TABLET | Freq: Three times a day (TID) | ORAL | 2 refills | Status: DC | PRN

## 2016-05-09 MED ORDER — ONDANSETRON HCL 8 MG PO TABS: 8.0000 mg | ORAL_TABLET | Freq: Three times a day (TID) | ORAL | 2 refills | Status: AC | PRN

## 2016-05-09 NOTE — Telephone Encounter (Signed)
  Oncology Nurse Navigator Documentation  Navigator Location: [facility] (05/09/16 1400) Navigator Encounter Type: Telephone (05/09/16 1400) Telephone: Medication Assistance;Incoming Call;Outgoing Call;Appt Confirmation/Clarification;Diagnostic Results (05/09/16 1400)                                        Time Spent with Patient: 60 (05/09/16 1400)   Received call from Mr Correa prior to appt today to see if his biopsy results were back. They will not be back in time for his appt today so that appt has been rescheduled to next week. He also reports multiple diarrhea stoosl a day and poor appetite so that he is not eating. States when he thinks about food he gets sick on his stomach. He does not vomit but just feels so sick he cannot eat. Per Dr Grayland Ormond he can take imodium OTC and needs to call clinic if no improvement in 24 hours. He also is being called in zofran for nausea. Drug needed prior authorization. Patient reports being on a fixed income. After speaking with PSN, zofran called in to [facility] drug and will be paid for with the [employer]. Mr. Majid verbalized understanding of medication education and calling clinic if no improvement in 24 hours.

## 2016-05-12 NOTE — Progress Notes (Signed)
[place]  [phone]) [phone] Fax:(336) [phone]  ID: Jonathon Benjamin OB: [birth year]  MR#: [id number]  [id number]  Patient Care Team: Sherrin Daisy, MD as PCP - General (Family Medicine) Clent Jacks, RN as Registered Nurse  CHIEF COMPLAINT: Liver masses  INTERVAL HISTORY: Patient returns to clinic today for further evaluation. He continues to have persistent nausea and diarrhea, but otherwise feels well. He denies any pain. He has no neurologic complaints. He denies any recent fevers or illnesses. He has a good appetite and denies weight loss. He has no chest pain or shortness of breath. He denies any nausea or vomiting. He has no melena or hematochezia. He has no urinary complaints. Patient offers no further specific complaints today.  REVIEW OF SYSTEMS:   Review of Systems  Constitutional: Negative.  Negative for fever, malaise/fatigue and weight loss.  Respiratory: Negative.  Negative for cough and shortness of breath.   Cardiovascular: Negative.  Negative for chest pain and leg swelling.  Gastrointestinal: Positive for diarrhea and nausea. Negative for abdominal pain, blood in stool, constipation, melena and vomiting.  Genitourinary: Negative.   Musculoskeletal: Negative.   Neurological: Negative.  Negative for weakness.  Psychiatric/Behavioral: Negative.     As per HPI. Otherwise, a complete review of systems is negative.  PAST MEDICAL HISTORY: Past Medical History:  Diagnosis Date  . Hypertension   . Prostate cancer ([place])   . Skin cancer   . Stomach cancer ([place])   . Stroke [facility])     PAST SURGICAL HISTORY: Past Surgical History:  Procedure Laterality Date  . VASCULAR SURGERY      FAMILY HISTORY: Family History  Problem Relation Age of Onset  . Cancer Maternal Aunt   . Heart attack Maternal Grandfather        ADVANCED DIRECTIVES (Y/N):  N   HEALTH MAINTENANCE: Social History  Substance Use Topics  . Smoking status:  Former Smoker    Quit date: 05/07/2008  . Smokeless tobacco: Not on file  . Alcohol use No     Colonoscopy:  PAP:  Bone density:  Lipid panel:  No Known Allergies  Current Outpatient Prescriptions  Medication Sig Dispense Refill  . amLODipine (NORVASC) 5 MG tablet Take 5 mg by mouth daily.     Marland Kitchen aspirin EC 81 MG tablet Take 81 mg by mouth daily.     . benazepril (LOTENSIN) 20 MG tablet Take 20 mg by mouth daily.     . citalopram (CELEXA) 20 MG tablet Take by mouth.    . Loratadine 10 MG CAPS Take 10 mg by mouth.     . ondansetron (ZOFRAN) 8 MG tablet Take 1 tablet (8 mg total) by mouth every 8 (eight) hours as needed for nausea or vomiting. 30 tablet 2  . simvastatin (ZOCOR) 20 MG tablet Take 20 mg by mouth daily.     . diphenoxylate-atropine (LOMOTIL) 2.5-0.025 MG tablet Take 1 tablet by mouth 4 (four) times daily as needed for diarrhea or loose stools. 30 tablet 0  . promethazine (PHENERGAN) 25 MG tablet Take 1 tablet (25 mg total) by mouth every 6 (six) hours as needed for nausea or vomiting. 30 tablet 0  . promethazine (PHENERGAN) 25 MG tablet Take 1 tablet (25 mg total) by mouth every 6 (six) hours as needed for nausea or vomiting. 30 tablet 0   No current facility-administered medications for this visit.     OBJECTIVE: Vitals:   05/13/16 1142  BP: 140/65  Pulse: 71  Resp: 18  Temp: 97.5 F (36.4 C)     Body mass index is 23.06 kg/m.    ECOG FS:0 - Asymptomatic  General: Well-developed, well-nourished, no acute distress. Eyes: Pink conjunctiva, anicteric sclera. Lungs: Clear to auscultation bilaterally. Heart: Regular rate and rhythm. No rubs, murmurs, or gallops. Abdomen: Soft, nontender, mildly distended, palpable mass noted in left lobe of liver.  Musculoskeletal: No edema, cyanosis, or clubbing. Neuro: Alert, answering all questions appropriately. Cranial nerves grossly intact. Skin: No rashes or petechiae noted. Psych: Normal affect.   LAB RESULTS:  Lab  Results  Component Value Date   NA 135 04/23/2016   K 4.7 04/23/2016   CL 102 04/23/2016   CO2 25 04/23/2016   GLUCOSE 105 (H) 04/23/2016   BUN 17 04/23/2016   CREATININE 1.29 (H) 04/23/2016   CALCIUM 11.4 (H) 04/23/2016   PROT 8.2 (H) 04/23/2016   ALBUMIN 3.5 04/23/2016   AST 32 04/23/2016   ALT 16 (L) 04/23/2016   ALKPHOS 288 (H) 04/23/2016   BILITOT 0.4 04/23/2016   GFRNONAA 53 (L) 04/23/2016   GFRAA >60 04/23/2016    Lab Results  Component Value Date   WBC 9.2 05/07/2016   NEUTROABS 6.9 (H) 04/23/2016   HGB 12.2 (L) 05/07/2016   HCT 35.5 (L) 05/07/2016   MCV 80.8 05/07/2016   PLT 510 (H) 05/07/2016    STUDIES: Ct Abdomen Pelvis W Contrast  Result Date: 04/15/2016 CLINICAL DATA:  Right upper quadrant mass. EXAM: CT ABDOMEN AND PELVIS WITH CONTRAST TECHNIQUE: Multidetector CT imaging of the abdomen and pelvis was performed using the standard protocol following bolus administration of intravenous contrast. CONTRAST:  140mL ISOVUE-300 IOPAMIDOL (ISOVUE-300) INJECTION 61% COMPARISON:  CTA 01/12/2007 FINDINGS: Lower chest and abdominal wall: Mild fatty enlargement of the left inguinal canal. Emphysematous changes seen in the basilar lungs. No visible nodule on scout image covering the lower chest. Hepatobiliary: There is a large mass in the lower central liver measuring at least 14 cm, invading the gallbladder. There is also a 7 cm segment 7/8 mass with the same heterogeneous hypo enhancement. 7 mm segment 2 lesion also noted. No convincing underlying cirrhosis. No porta hepatis adenopathy or evidence of tumor thrombus. Hilar mass effect causes mild intrahepatic bile duct dilatation Pancreas: Unremarkable. Spleen: Unremarkable. 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 Bilateral renal cysts. Smooth right renal atrophy, likely arterial compromise given the pattern. Left renal calculi including 6 mm stone in the urinary  pelvis. No hydronephrosis or ureteral calculus . Bladder wall thickening and small left-sided diverticulum. Presumed chronic outlet obstruction Stomach/Bowel:  No primary mass is seen. No obstruction. Reproductive:Enlarged prostate with large central nodular component projecting into the bladder base. Vascular/Lymphatic: Diffuse advanced atherosclerotic disease. Right renal artery ostial stenosis, likely accounting for asymmetric atrophy. No adenopathy noted. No mass or adenopathy. Other: No ascites or pneumoperitoneum. Musculoskeletal: No visible metastasis. These results will be called to the ordering clinician or representative by the Radiologist Assistant, and communication documented in the PACS or zVision Dashboard. IMPRESSION: 1. 14 cm liver mass correlating with physical exam finding, invading the gallbladder. This could be a primary malignancy or metastasis. There is also a 7 cm right liver mass, 7 mm segment 2 liver nodule, and bilateral adrenal metastases. No extrahepatic primary identified. 2.  Emphysema. (ICD10-J43.9) 3. Incidental findings noted above. Electronically Signed   By: Monte Fantasia M.D.   On: 04/15/2016 10:29   US Biopsy  Result Date: 05/07/2016  INDICATION: Liver lesions EXAM: ULTRASOUND-GUIDED BIOPSY OF A LARGE LIVER MASS. MEDICATIONS: None. ANESTHESIA/SEDATION: Fentanyl 50 mcg IV; Versed 1 mg IV Moderate Sedation Time:  10 minutes The patient was continuously monitored during the procedure by the interventional radiology nurse under my direct supervision. FLUOROSCOPY TIME:  Fluoroscopy Time:  minutes  seconds ( mGy). COMPLICATIONS: None immediate. PROCEDURE: Informed written consent was obtained from the patient after a thorough discussion of the procedural risks, benefits and alternatives. All questions were addressed. Maximal Sterile Barrier Technique was utilized including caps, mask, sterile gowns, sterile gloves, sterile drape, hand hygiene and skin antiseptic. A timeout was  performed prior to the initiation of the procedure. The epigastrium was prepped with ChloraPrep in a sterile fashion, and a sterile drape was applied covering the operative field. A sterile gown and sterile gloves were used for the procedure. Under sonographic guidance, an 17 gauge guide needle was advanced into the large liver mass. Subsequently 3 18 gauge core biopsies were obtained. Gel-Foam slurry was injected into the needle tract. The guide needle was removed. Final imaging was performed. Patient tolerated the procedure well without complication. Vital sign monitoring by nursing staff during the procedure will continue as patient is in the special procedures unit for post procedure observation. FINDINGS: The images document guide needle placement within the large liver mass. Post biopsy images demonstrate no hemorrhage. IMPRESSION: Successful ultrasound-guided core biopsy of a large liver mass. Electronically Signed   By: Marybelle Killings M.D.   On: 05/07/2016 12:11    ASSESSMENT: Liver masses  PLAN:    1. Liver masses: Highly suspicious for underlying malignancy, possibly neuroendocrine origin. CT scan results from April 15, 2016 reviewed independently and reported as above.  Patient has a significantly elevated AFP at 152 and a mildly elevated CA-19-9 at 68, but his CEA and PSA are within normal limits. Biopsy results continue to be pending at time of dictation. We will call patient with results are finalized to arrange further follow-up to discuss treatment planning. 2. Hypercalcemia: Likely secondary to underlying malignancy, monitor. 3. Diarrhea: Patient was given a prescription for Lomotil. 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 4. Nausea: Patient states ondansetron did not work and was given a prescription for Phenergan today.   Approximately 30 minutes was spent in discussion of which greater than 50% was consultation.  Patient expressed understanding  and was in agreement with this plan. He also understands that He can call clinic at any time with any questions, concerns, or complaints.   No matching staging information was found for the patient.  Lloyd Huger, MD   05/13/2016 9:56 PM     [place]  Telephone:(336) [phone] [phone]) [phone]  ID: Jonathon Benjamin OB: 11/19/1940  MR#: [id number]  [id number]  Patient Care Team: Sherrin Daisy, MD as PCP - General (Family Medicine) Clent Jacks, RN as Registered Nurse  CHIEF COMPLAINT: Liver masses  INTERVAL HISTORY: Patient is 75 year old male who is undergoing ultrasound to evaluate his aorta by vascular surgery and incidentally noted to have multiple liver masses. Subsequent CT scan revealed a 14 cm mass as well as a 7 cm mass highly suspicious for underlying malignancy. Currently, patient feels well and is asymptomatic. He denies any pain. He has no neurologic complaints. He denies any recent fevers or illnesses. He has a good appetite and denies weight loss. He has no chest pain or shortness of breath. He denies any nausea, vomiting, constipation, or diarrhea. He has  no melena or hematochezia. He has no urinary complaints. Patient offers no specific complaints today.  REVIEW OF SYSTEMS:   Review of Systems  Constitutional: Negative.  Negative for fever, malaise/fatigue and weight loss.  Respiratory: Negative.  Negative for cough and shortness of breath.   Cardiovascular: Negative.  Negative for chest pain and leg swelling.  Gastrointestinal: Negative.  Negative for abdominal pain, blood in stool, constipation, diarrhea, melena, nausea and vomiting.  Genitourinary: Negative.   Neurological: Negative.  Negative for weakness.  Psychiatric/Behavioral: Negative.     As per HPI. Otherwise, a complete review of systems is negatve.  PAST MEDICAL HISTORY: Past Medical History:  Diagnosis Date  . Hypertension   . Prostate cancer ([place])   . Skin  cancer   . Stomach cancer ([place])   . Stroke [facility])     PAST SURGICAL HISTORY: Past Surgical History:  Procedure Laterality Date  . VASCULAR SURGERY      FAMILY HISTORY: Family History  Problem Relation Age of Onset  . Cancer Maternal Aunt   . Heart attack Maternal Grandfather        ADVANCED DIRECTIVES (Y/N):  N   HEALTH MAINTENANCE: Social History  Substance Use Topics  . Smoking status: Former Smoker    Quit date: 05/07/2008  . Smokeless tobacco: Not on file  . Alcohol use No     Colonoscopy:  PAP:  Bone density:  Lipid panel:  No Known Allergies  Current Outpatient Prescriptions  Medication Sig Dispense Refill  . amLODipine (NORVASC) 5 MG tablet Take 5 mg by mouth daily.     Marland Kitchen aspirin EC 81 MG tablet Take 81 mg by mouth daily.     . benazepril (LOTENSIN) 20 MG tablet Take 20 mg by mouth daily.     . citalopram (CELEXA) 20 MG tablet Take by mouth.    . Loratadine 10 MG CAPS Take 10 mg by mouth.     . ondansetron (ZOFRAN) 8 MG tablet Take 1 tablet (8 mg total) by mouth every 8 (eight) hours as needed for nausea or vomiting. 30 tablet 2  . simvastatin (ZOCOR) 20 MG tablet Take 20 mg by mouth daily.     . diphenoxylate-atropine (LOMOTIL) 2.5-0.025 MG tablet Take 1 tablet by mouth 4 (four) times daily as needed for diarrhea or loose stools. 30 tablet 0  . promethazine (PHENERGAN) 25 MG tablet Take 1 tablet (25 mg total) by mouth every 6 (six) hours as needed for nausea or vomiting. 30 tablet 0  . promethazine (PHENERGAN) 25 MG tablet Take 1 tablet (25 mg total) by mouth every 6 (six) hours as needed for nausea or vomiting. 30 tablet 0   No current facility-administered medications for this visit.     OBJECTIVE: Vitals:   05/13/16 1142  BP: 140/65  Pulse: 71  Resp: 18  Temp: 97.5 F (36.4 C)     Body mass index is 23.06 kg/m.    ECOG FS:0 - Asymptomatic  General: Well-developed, well-nourished, no acute distress. Eyes: Pink conjunctiva, anicteric  sclera. HEENT: Normocephalic, moist mucous membranes, clear oropharnyx. Lungs: Clear to auscultation bilaterally. Heart: Regular rate and rhythm. No rubs, murmurs, or gallops. Abdomen: Soft, nontender, mildly distended, palpable mass noted in left lobe of liver.  Musculoskeletal: No edema, cyanosis, or clubbing. Neuro: Alert, answering all questions appropriately. Cranial nerves grossly intact. Skin: No rashes or petechiae noted. Psych: Normal affect. Lymphatics: No cervical, calvicular, axillary or inguinal LAD.   LAB RESULTS:  Lab Results  Component Value  Date   NA 135 04/23/2016   K 4.7 04/23/2016   CL 102 04/23/2016   CO2 25 04/23/2016   GLUCOSE 105 (H) 04/23/2016   BUN 17 04/23/2016   CREATININE 1.29 (H) 04/23/2016   CALCIUM 11.4 (H) 04/23/2016   PROT 8.2 (H) 04/23/2016   ALBUMIN 3.5 04/23/2016   AST 32 04/23/2016   ALT 16 (L) 04/23/2016   ALKPHOS 288 (H) 04/23/2016   BILITOT 0.4 04/23/2016   GFRNONAA 53 (L) 04/23/2016   GFRAA >60 04/23/2016    Lab Results  Component Value Date   WBC 9.2 05/07/2016   NEUTROABS 6.9 (H) 04/23/2016   HGB 12.2 (L) 05/07/2016   HCT 35.5 (L) 05/07/2016   MCV 80.8 05/07/2016   PLT 510 (H) 05/07/2016    STUDIES: Ct Abdomen Pelvis W Contrast  Result Date: 04/15/2016 CLINICAL DATA:  Right upper quadrant mass. EXAM: CT ABDOMEN AND PELVIS WITH CONTRAST TECHNIQUE: Multidetector CT imaging of the abdomen and pelvis was performed using the standard protocol following bolus administration of intravenous contrast. CONTRAST:  15mL ISOVUE-300 IOPAMIDOL (ISOVUE-300) INJECTION 61% COMPARISON:  CTA 01/12/2007 FINDINGS: Lower chest and abdominal wall: Mild fatty enlargement of the left inguinal canal. Emphysematous changes seen in the basilar lungs. No visible nodule on scout image covering the lower chest. Hepatobiliary: There is a large mass in the lower central liver measuring at least 14 cm, invading the gallbladder. There is also a 7 cm segment 7/8  mass with the same heterogeneous hypo enhancement. 7 mm segment 2 lesion also noted. No convincing underlying cirrhosis. No porta hepatis adenopathy or evidence of tumor thrombus. Hilar mass effect causes mild intrahepatic bile duct dilatation Pancreas: Unremarkable. Spleen: Unremarkable. 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 Bilateral renal cysts. Smooth right renal atrophy, likely arterial compromise given the pattern. Left renal calculi including 6 mm stone in the urinary pelvis. No hydronephrosis or ureteral calculus . Bladder wall thickening and small left-sided diverticulum. Presumed chronic outlet obstruction Stomach/Bowel:  No primary mass is seen. No obstruction. Reproductive:Enlarged prostate with large central nodular component projecting into the bladder base. Vascular/Lymphatic: Diffuse advanced atherosclerotic disease. Right renal artery ostial stenosis, likely accounting for asymmetric atrophy. No adenopathy noted. No mass or adenopathy. Other: No ascites or pneumoperitoneum. Musculoskeletal: No visible metastasis. These results will be called to the ordering clinician or representative by the Radiologist Assistant, and communication documented in the PACS or zVision Dashboard. IMPRESSION: 1. 14 cm liver mass correlating with physical exam finding, invading the gallbladder. This could be a primary malignancy or metastasis. There is also a 7 cm right liver mass, 7 mm segment 2 liver nodule, and bilateral adrenal metastases. No extrahepatic primary identified. 2.  Emphysema. (ICD10-J43.9) 3. Incidental findings noted above. Electronically Signed   By: Monte Fantasia M.D.   On: 04/15/2016 10:29   US Biopsy  Result Date: 05/07/2016 INDICATION: Liver lesions EXAM: ULTRASOUND-GUIDED BIOPSY OF A LARGE LIVER MASS. MEDICATIONS: None. ANESTHESIA/SEDATION: Fentanyl 50 mcg IV; Versed 1 mg IV Moderate Sedation Time:  10 minutes The  patient was continuously monitored during the procedure by the interventional radiology nurse under my direct supervision. FLUOROSCOPY TIME:  Fluoroscopy Time:  minutes  seconds ( mGy). COMPLICATIONS: None immediate. PROCEDURE: Informed written consent was obtained from the patient after a thorough discussion of the procedural risks, benefits and alternatives. All questions were addressed. Maximal Sterile Barrier Technique was utilized including caps, mask, sterile gowns, sterile gloves, sterile drape, hand hygiene and  skin antiseptic. A timeout was performed prior to the initiation of the procedure. The epigastrium was prepped with ChloraPrep in a sterile fashion, and a sterile drape was applied covering the operative field. A sterile gown and sterile gloves were used for the procedure. Under sonographic guidance, an 17 gauge guide needle was advanced into the large liver mass. Subsequently 3 18 gauge core biopsies were obtained. Gel-Foam slurry was injected into the needle tract. The guide needle was removed. Final imaging was performed. Patient tolerated the procedure well without complication. Vital sign monitoring by nursing staff during the procedure will continue as patient is in the special procedures unit for post procedure observation. FINDINGS: The images document guide needle placement within the large liver mass. Post biopsy images demonstrate no hemorrhage. IMPRESSION: Successful ultrasound-guided core biopsy of a large liver mass. Electronically Signed   By: Marybelle Killings M.D.   On: 05/07/2016 12:11    ASSESSMENT: Liver masses  PLAN:    1. Liver masses: Highly suspicious for underlying malignancy. CT scan results from April 15, 2016 reviewed independently and reported as above.  Patient has a significantly elevated AFP at 152 and a mildly elevated CA-19-9 at 68, but his CEA and PSA are within normal limits. We will get ultrasound guided biopsy in the next 1-2 weeks. Patient will then follow-up  in 2-3 days after biopsy to discuss the results and treatment planning.  Approximately 45 minutes was spent in discussion of which greater than 50% was consultation.  Patient expressed understanding and was in agreement with this plan. He also understands that He can call clinic at any time with any questions, concerns, or complaints.   No matching staging information was found for the patient.  Lloyd Huger, MD   05/13/2016 9:56 PM

## 2016-05-13 ENCOUNTER — Inpatient Hospital Stay: Payer: Medicare Other | Attending: Oncology | Admitting: Oncology

## 2016-05-13 VITALS — BP 140/65 | HR 71 | Temp 97.5°F | Resp 18 | Wt 142.9 lb

## 2016-05-13 DIAGNOSIS — Z79899 Other long term (current) drug therapy: Secondary | ICD-10-CM | POA: Diagnosis not present

## 2016-05-13 DIAGNOSIS — R7989 Other specified abnormal findings of blood chemistry: Secondary | ICD-10-CM | POA: Insufficient documentation

## 2016-05-13 DIAGNOSIS — R160 Hepatomegaly, not elsewhere classified: Secondary | ICD-10-CM | POA: Insufficient documentation

## 2016-05-13 DIAGNOSIS — C787 Secondary malignant neoplasm of liver and intrahepatic bile duct: Secondary | ICD-10-CM | POA: Insufficient documentation

## 2016-05-13 DIAGNOSIS — Z8546 Personal history of malignant neoplasm of prostate: Secondary | ICD-10-CM | POA: Diagnosis not present

## 2016-05-13 DIAGNOSIS — R197 Diarrhea, unspecified: Secondary | ICD-10-CM | POA: Diagnosis not present

## 2016-05-13 DIAGNOSIS — I10 Essential (primary) hypertension: Secondary | ICD-10-CM | POA: Insufficient documentation

## 2016-05-13 DIAGNOSIS — Z87891 Personal history of nicotine dependence: Secondary | ICD-10-CM | POA: Insufficient documentation

## 2016-05-13 DIAGNOSIS — R978 Other abnormal tumor markers: Secondary | ICD-10-CM | POA: Diagnosis not present

## 2016-05-13 DIAGNOSIS — R110 Nausea: Secondary | ICD-10-CM | POA: Diagnosis not present

## 2016-05-13 DIAGNOSIS — C7A1 Malignant poorly differentiated neuroendocrine tumors: Secondary | ICD-10-CM | POA: Diagnosis not present

## 2016-05-13 MED ORDER — DIPHENOXYLATE-ATROPINE 2.5-0.025 MG PO TABS: 1.0000 | ORAL_TABLET | Freq: Four times a day (QID) | ORAL | 0 refills | Status: AC | PRN

## 2016-05-13 MED ORDER — PROMETHAZINE HCL 25 MG PO TABS: 25.0000 mg | ORAL_TABLET | Freq: Four times a day (QID) | ORAL | 0 refills | Status: AC | PRN

## 2016-05-13 MED ORDER — PROMETHAZINE HCL 25 MG PO TABS: 25.0000 mg | ORAL_TABLET | Freq: Four times a day (QID) | ORAL | 0 refills | Status: DC | PRN

## 2016-05-13 NOTE — Progress Notes (Signed)
States has diarrhea and decreased appetite.

## 2016-05-15 ENCOUNTER — Ambulatory Visit: Payer: Medicare Other | Admitting: Oncology

## 2016-05-16 ENCOUNTER — Telehealth: Payer: Self-pay

## 2016-05-16 ENCOUNTER — Other Ambulatory Visit: Payer: Self-pay | Admitting: Oncology

## 2016-05-16 ENCOUNTER — Other Ambulatory Visit: Payer: Self-pay | Admitting: *Deleted

## 2016-05-16 DIAGNOSIS — C7A1 Malignant poorly differentiated neuroendocrine tumors: Secondary | ICD-10-CM

## 2016-05-16 LAB — SURGICAL PATHOLOGY

## 2016-05-16 MED ORDER — OXYCODONE HCL 10 MG PO TABS: 10.0000 mg | ORAL_TABLET | Freq: Four times a day (QID) | ORAL | 0 refills | Status: DC | PRN

## 2016-05-16 NOTE — Telephone Encounter (Signed)
  Oncology Nurse Navigator Documentation Received call from Jonathon Benjamin with new onset of abdominal pain. Requesting medication for it. He was notified that a new script for oxycodone will be available at the front desk at the cancer center. In regards to his pathology he was notified that ir was not liver cancer and that Jonathon Benjamin has arranged an appt for 9/18 and will go over pathology and start treatment for neuroendocrine symptoms. He states he will come tomorrow and get his script and asked if I could go over appt again at that time.  Navigator Location: [facility] (05/16/16 1400) Navigator Encounter Type: Telephone (05/16/16 1400) Telephone: Outgoing Call;Symptom Mgt (05/16/16 1400)                                        Time Spent with Patient: 30 (05/16/16 1400)

## 2016-05-17 ENCOUNTER — Other Ambulatory Visit: Payer: Self-pay | Admitting: *Deleted

## 2016-05-17 DIAGNOSIS — C7A8 Other malignant neuroendocrine tumors: Secondary | ICD-10-CM

## 2016-05-19 NOTE — Progress Notes (Signed)
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  INTERVAL HISTORY: Patient returns to clinic today for further evaluation, discussion of his pathology results, and treatment planning. Patient states his nausea has improved, but he continues to have persistent diarrhea. He also complains of increased weakness and fatigue. He has no neurologic complaints. He denies any recent fevers or illnesses. He has a good appetite and denies weight loss. He has no chest pain or shortness of breath. He denies any nausea or vomiting. He has no melena or hematochezia. He has no urinary complaints. Patient offers no further specific complaints today.  REVIEW OF SYSTEMS:   Review of Systems  Constitutional: Positive for weight loss. Negative for fever and malaise/fatigue.  Respiratory: Negative.  Negative for cough and shortness of breath.   Cardiovascular: Negative.  Negative for chest pain and leg swelling.  Gastrointestinal: Positive for diarrhea. Negative for abdominal pain, blood in stool, constipation, melena, nausea and vomiting.  Genitourinary: Negative.   Musculoskeletal: Negative.   Neurological: Negative.  Negative for weakness.  Psychiatric/Behavioral: Negative.  The patient is not nervous/anxious.     As per HPI. Otherwise, a complete review of systems is negative.  PAST MEDICAL HISTORY: Past Medical History:  Diagnosis Date  . Hypertension   . Prostate cancer ([place])   . Skin cancer   . Stomach cancer ([place])   . Stroke [facility])     PAST SURGICAL HISTORY: Past Surgical History:  Procedure Laterality Date  . VASCULAR SURGERY      FAMILY HISTORY: Family History  Problem Relation Age of Onset  . Cancer  Maternal Aunt   . Heart attack Maternal Grandfather        ADVANCED DIRECTIVES (Y/N):  N   HEALTH MAINTENANCE: Social History  Substance Use Topics  . Smoking status: Former Smoker    Quit date: 05/07/2008  . Smokeless tobacco: Not on file  . Alcohol use No     Colonoscopy:  PAP:  Bone density:  Lipid panel:  No Known Allergies  Current Outpatient Prescriptions  Medication Sig Dispense Refill  . amLODipine (NORVASC) 5 MG tablet Take 5 mg by mouth daily.     Marland Kitchen aspirin EC 81 MG tablet Take 81 mg by mouth daily.     . benazepril (LOTENSIN) 20 MG tablet Take 20 mg by mouth daily.     . citalopram (CELEXA) 20 MG tablet Take by mouth.    . diphenoxylate-atropine (LOMOTIL) 2.5-0.025 MG tablet Take 1 tablet by mouth 4 (four) times daily as needed for diarrhea or loose stools. 30 tablet 0  . Loratadine 10 MG CAPS Take 10 mg by mouth.     . ondansetron (ZOFRAN) 8 MG tablet Take 1 tablet (8 mg total) by mouth every 8 (eight) hours as needed for nausea or vomiting. 30 tablet 2  . Oxycodone HCl 10 MG TABS Take 1 tablet (10 mg total) by mouth every 6 (six) hours as needed. 90 tablet 0  . promethazine (PHENERGAN) 25 MG tablet Take 1 tablet (25 mg total) by mouth every 6 (six) hours as needed for nausea or vomiting. 30 tablet 0  . promethazine (PHENERGAN) 25 MG tablet Take 1 tablet (25 mg total) by mouth every 6 (six) hours as needed for nausea  or vomiting. 30 tablet 0  . simvastatin (ZOCOR) 20 MG tablet Take 20 mg by mouth daily.     . megestrol (MEGACE) 40 MG tablet Take 1 tablet (40 mg total) by mouth daily. 30 tablet 3   No current facility-administered medications for this visit.     OBJECTIVE: Vitals:   05/20/16 1451  BP: (!) 145/72  Pulse: 65  Resp: 18  Temp: (!) 95.1 F (35.1 C)     Body mass index is 22.63 kg/m.    ECOG FS:0 - Asymptomatic  General: Well-developed, well-nourished, no acute distress. Eyes: Pink conjunctiva, anicteric sclera. Lungs: Clear to auscultation  bilaterally. Heart: Regular rate and rhythm. No rubs, murmurs, or gallops. Abdomen: Soft, nontender, mildly distended, palpable mass noted in left lobe of liver.  Musculoskeletal: No edema, cyanosis, or clubbing. Neuro: Alert, answering all questions appropriately. Cranial nerves grossly intact. Skin: No rashes or petechiae noted. Psych: Normal affect.   LAB RESULTS:  Lab Results  Component Value Date   NA 135 04/23/2016   K 4.7 04/23/2016   CL 102 04/23/2016   CO2 25 04/23/2016   GLUCOSE 105 (H) 04/23/2016   BUN 17 04/23/2016   CREATININE 1.29 (H) 04/23/2016   CALCIUM 11.4 (H) 04/23/2016   PROT 8.2 (H) 04/23/2016   ALBUMIN 3.5 04/23/2016   AST 32 04/23/2016   ALT 16 (L) 04/23/2016   ALKPHOS 288 (H) 04/23/2016   BILITOT 0.4 04/23/2016   GFRNONAA 53 (L) 04/23/2016   GFRAA >60 04/23/2016    Lab Results  Component Value Date   WBC 9.2 May 24, 2016   NEUTROABS 6.9 (H) 04/23/2016   HGB 12.2 (L) May 24, 2016   HCT 35.5 (L) 05-24-2016   MCV 80.8 05/24/2016   PLT 510 (H) 2016-05-24    STUDIES: US Biopsy  Result Date: 05/24/16 INDICATION: Liver lesions EXAM: ULTRASOUND-GUIDED BIOPSY OF A LARGE LIVER MASS. MEDICATIONS: None. ANESTHESIA/SEDATION: Fentanyl 50 mcg IV; Versed 1 mg IV Moderate Sedation Time:  10 minutes The patient was continuously monitored during the procedure by the interventional radiology nurse under my direct supervision. FLUOROSCOPY TIME:  Fluoroscopy Time:  minutes  seconds ( mGy). COMPLICATIONS: None immediate. PROCEDURE: Informed written consent was obtained from the patient after a thorough discussion of the procedural risks, benefits and alternatives. All questions were addressed. Maximal Sterile Barrier Technique was utilized including caps, mask, sterile gowns, sterile gloves, sterile drape, hand hygiene and skin antiseptic. A timeout was performed prior to the initiation of the procedure. The epigastrium was prepped with ChloraPrep in a sterile fashion, and a  sterile drape was applied covering the operative field. A sterile gown and sterile gloves were used for the procedure. Under sonographic guidance, an 17 gauge guide needle was advanced into the large liver mass. Subsequently 3 18 gauge core biopsies were obtained. Gel-Foam slurry was injected into the needle tract. The guide needle was removed. Final imaging was performed. Patient tolerated the procedure well without complication. Vital sign monitoring by nursing staff during the procedure will continue as patient is in the special procedures unit for post procedure observation. FINDINGS: The images document guide needle placement within the large liver mass. Post biopsy images demonstrate no hemorrhage. IMPRESSION: Successful ultrasound-guided core biopsy of a large liver mass. Electronically Signed   By: Marybelle Killings M.D.   On: 05/24/16 12:11    ASSESSMENT: Liver masses  PLAN:    1. Liver masses: Highly suspicious for underlying malignancy, possibly neuroendocrine origin. CT scan results from April 15, 2016 reviewed independently  and reported as above.  Patient has a significantly elevated AFP at 152 and a mildly elevated CA-19-9 at 68, but his CEA and PSA are within normal limits. Biopsy results continue to be pending at time of dictation. We will call patient with results are finalized to arrange further follow-up to discuss treatment planning. 2. Hypercalcemia: Likely secondary to underlying malignancy, monitor. 3. Diarrhea: Patient was given a prescription for Lomotil. 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 4. Nausea: Patient states ondansetron did not work and was given a prescription for Phenergan today.   Approximately 30 minutes was spent in discussion of which greater than 50% was consultation.  Patient expressed understanding and was in agreement with this plan. He also understands that He can call clinic at any time with any questions, concerns, or  complaints.   No matching staging information was found for the patient.  Lloyd Huger, MD   05/20/2016 9:51 PM     [place]  Telephone:(336) [phone] Fax:(336) [phone]  ID: Jonathon Benjamin OB: 03-Nov-1940  MR#: [id number]  [id number]  Patient Care Team: Sherrin Daisy, MD as PCP - General (Family Medicine) Clent Jacks, RN as Registered Nurse  CHIEF COMPLAINT: Liver masses  INTERVAL HISTORY: Patient is 75 year old male who is undergoing ultrasound to evaluate his aorta by vascular surgery and incidentally noted to have multiple liver masses. Subsequent CT scan revealed a 14 cm mass as well as a 7 cm mass highly suspicious for underlying malignancy. Currently, patient feels well and is asymptomatic. He denies any pain. He has no neurologic complaints. He denies any recent fevers or illnesses. He has a good appetite and denies weight loss. He has no chest pain or shortness of breath. He denies any nausea, vomiting, constipation, or diarrhea. He has no melena or hematochezia. He has no urinary complaints. Patient offers no specific complaints today.  REVIEW OF SYSTEMS:   Review of Systems  Constitutional: Negative.  Negative for fever, malaise/fatigue and weight loss.  Respiratory: Negative.  Negative for cough and shortness of breath.   Cardiovascular: Negative.  Negative for chest pain and leg swelling.  Gastrointestinal: Negative.  Negative for abdominal pain, blood in stool, constipation, diarrhea, melena, nausea and vomiting.  Genitourinary: Negative.   Neurological: Negative.  Negative for weakness.  Psychiatric/Behavioral: Negative.     As per HPI. Otherwise, a complete review of systems is negatve.  PAST MEDICAL HISTORY: Past Medical History:  Diagnosis Date  . Hypertension   . Prostate cancer ([place])   . Skin cancer   . Stomach cancer ([place])   . Stroke [facility])     PAST SURGICAL HISTORY: Past Surgical History:  Procedure Laterality  Date  . VASCULAR SURGERY      FAMILY HISTORY: Family History  Problem Relation Age of Onset  . Cancer Maternal Aunt   . Heart attack Maternal Grandfather        ADVANCED DIRECTIVES (Y/N):  N   HEALTH MAINTENANCE: Social History  Substance Use Topics  . Smoking status: Former Smoker    Quit date: 05/07/2008  . Smokeless tobacco: Not on file  . Alcohol use No     Colonoscopy:  PAP:  Bone density:  Lipid panel:  No Known Allergies  Current Outpatient Prescriptions  Medication Sig Dispense Refill  . amLODipine (NORVASC) 5 MG tablet Take 5 mg by mouth daily.     Marland Kitchen aspirin EC 81 MG tablet Take 81 mg by mouth daily.     Marland Kitchen  benazepril (LOTENSIN) 20 MG tablet Take 20 mg by mouth daily.     . citalopram (CELEXA) 20 MG tablet Take by mouth.    . diphenoxylate-atropine (LOMOTIL) 2.5-0.025 MG tablet Take 1 tablet by mouth 4 (four) times daily as needed for diarrhea or loose stools. 30 tablet 0  . Loratadine 10 MG CAPS Take 10 mg by mouth.     . ondansetron (ZOFRAN) 8 MG tablet Take 1 tablet (8 mg total) by mouth every 8 (eight) hours as needed for nausea or vomiting. 30 tablet 2  . Oxycodone HCl 10 MG TABS Take 1 tablet (10 mg total) by mouth every 6 (six) hours as needed. 90 tablet 0  . promethazine (PHENERGAN) 25 MG tablet Take 1 tablet (25 mg total) by mouth every 6 (six) hours as needed for nausea or vomiting. 30 tablet 0  . promethazine (PHENERGAN) 25 MG tablet Take 1 tablet (25 mg total) by mouth every 6 (six) hours as needed for nausea or vomiting. 30 tablet 0  . simvastatin (ZOCOR) 20 MG tablet Take 20 mg by mouth daily.     . megestrol (MEGACE) 40 MG tablet Take 1 tablet (40 mg total) by mouth daily. 30 tablet 3   No current facility-administered medications for this visit.     OBJECTIVE: Vitals:   05/20/16 1451  BP: (!) 145/72  Pulse: 65  Resp: 18  Temp: (!) 95.1 F (35.1 C)     Body mass index is 22.63 kg/m.    ECOG FS:0 - Asymptomatic  General: Well-developed,  well-nourished, no acute distress. Eyes: Pink conjunctiva, anicteric sclera. HEENT: Normocephalic, moist mucous membranes, clear oropharnyx. Lungs: Clear to auscultation bilaterally. Heart: Regular rate and rhythm. No rubs, murmurs, or gallops. Abdomen: Soft, nontender, mildly distended, palpable mass noted in left lobe of liver.  Musculoskeletal: No edema, cyanosis, or clubbing. Neuro: Alert, answering all questions appropriately. Cranial nerves grossly intact. Skin: No rashes or petechiae noted. Psych: Normal affect. Lymphatics: No cervical, calvicular, axillary or inguinal LAD.   LAB RESULTS:  Lab Results  Component Value Date   NA 135 04/23/2016   K 4.7 04/23/2016   CL 102 04/23/2016   CO2 25 04/23/2016   GLUCOSE 105 (H) 04/23/2016   BUN 17 04/23/2016   CREATININE 1.29 (H) 04/23/2016   CALCIUM 11.4 (H) 04/23/2016   PROT 8.2 (H) 04/23/2016   ALBUMIN 3.5 04/23/2016   AST 32 04/23/2016   ALT 16 (L) 04/23/2016   ALKPHOS 288 (H) 04/23/2016   BILITOT 0.4 04/23/2016   GFRNONAA 53 (L) 04/23/2016   GFRAA >60 04/23/2016    Lab Results  Component Value Date   WBC 9.2 2016-06-01   NEUTROABS 6.9 (H) 04/23/2016   HGB 12.2 (L) 06/01/2016   HCT 35.5 (L) 06-01-2016   MCV 80.8 June 01, 2016   PLT 510 (H) June 01, 2016    STUDIES: US Biopsy  Result Date: 06-01-2016 INDICATION: Liver lesions EXAM: ULTRASOUND-GUIDED BIOPSY OF A LARGE LIVER MASS. MEDICATIONS: None. ANESTHESIA/SEDATION: Fentanyl 50 mcg IV; Versed 1 mg IV Moderate Sedation Time:  10 minutes The patient was continuously monitored during the procedure by the interventional radiology nurse under my direct supervision. FLUOROSCOPY TIME:  Fluoroscopy Time:  minutes  seconds ( mGy). COMPLICATIONS: None immediate. PROCEDURE: Informed written consent was obtained from the patient after a thorough discussion of the procedural risks, benefits and alternatives. All questions were addressed. Maximal Sterile Barrier Technique was utilized  including caps, mask, sterile gowns, sterile gloves, sterile drape, hand hygiene and skin antiseptic. A  timeout was performed prior to the initiation of the procedure. The epigastrium was prepped with ChloraPrep in a sterile fashion, and a sterile drape was applied covering the operative field. A sterile gown and sterile gloves were used for the procedure. Under sonographic guidance, an 17 gauge guide needle was advanced into the large liver mass. Subsequently 3 18 gauge core biopsies were obtained. Gel-Foam slurry was injected into the needle tract. The guide needle was removed. Final imaging was performed. Patient tolerated the procedure well without complication. Vital sign monitoring by nursing staff during the procedure will continue as patient is in the special procedures unit for post procedure observation. FINDINGS: The images document guide needle placement within the large liver mass. Post biopsy images demonstrate no hemorrhage. IMPRESSION: Successful ultrasound-guided core biopsy of a large liver mass. Electronically Signed   By: Marybelle Killings M.D.   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 Case discussed with pathology. CT scan results from April 15, 2016 reviewed independently and reported as above.  Patient has a significantly elevated AFP at 152 and a mildly elevated CA-19-9 at 68, but his CEA and PSA are within normal limits. Proceed with 30 mg intramuscular or octreotide today. Chromogranin A and 5 HIAA 24 hour urine collection are pending. Will get a PET scan to complete staging workup and then patient will return to clinic in 1-2 weeks for further evaluation and discussion on whether or not to initiate more aggressive chemotherapy, possibly with cisplatin and etoposide. Referral also has been made to GI for consideration of colonoscopy to  assess for a primary lesion. 2. Diarrhea: Octreotide as above.   Approximately 30 minutes was spent in discussion of which greater than 50% was consultation.  Patient expressed understanding and was in agreement with this plan. He also understands that He can call clinic at any time with any questions, concerns, or complaints.   No matching staging information was found for the patient.  Lloyd Huger, MD   05/20/2016 9:51 PM

## 2016-05-20 ENCOUNTER — Inpatient Hospital Stay: Payer: Medicare Other

## 2016-05-20 ENCOUNTER — Inpatient Hospital Stay (HOSPITAL_BASED_OUTPATIENT_CLINIC_OR_DEPARTMENT_OTHER): Payer: Medicare Other | Admitting: Oncology

## 2016-05-20 VITALS — BP 145/72 | HR 65 | Temp 95.1°F | Resp 18 | Wt 140.2 lb

## 2016-05-20 DIAGNOSIS — C7A1 Malignant poorly differentiated neuroendocrine tumors: Secondary | ICD-10-CM

## 2016-05-20 DIAGNOSIS — Z79899 Other long term (current) drug therapy: Secondary | ICD-10-CM

## 2016-05-20 DIAGNOSIS — R160 Hepatomegaly, not elsewhere classified: Secondary | ICD-10-CM

## 2016-05-20 DIAGNOSIS — R110 Nausea: Secondary | ICD-10-CM

## 2016-05-20 DIAGNOSIS — R7989 Other specified abnormal findings of blood chemistry: Secondary | ICD-10-CM | POA: Diagnosis not present

## 2016-05-20 DIAGNOSIS — R197 Diarrhea, unspecified: Secondary | ICD-10-CM

## 2016-05-20 DIAGNOSIS — Z8546 Personal history of malignant neoplasm of prostate: Secondary | ICD-10-CM

## 2016-05-20 DIAGNOSIS — R978 Other abnormal tumor markers: Secondary | ICD-10-CM

## 2016-05-20 MED ORDER — OCTREOTIDE ACETATE 30 MG IM KIT
30.0000 mg | PACK | Freq: Once | INTRAMUSCULAR | Status: AC
  Administered 2016-05-20: 30 mg via INTRAMUSCULAR
  Filled 2016-05-20: qty 1

## 2016-05-20 MED ORDER — MEGESTROL ACETATE 40 MG PO TABS: 40.0000 mg | ORAL_TABLET | Freq: Every day | ORAL | 3 refills | Status: DC

## 2016-05-20 NOTE — Progress Notes (Signed)
Patient states he has a lot of leg pain.  Also having a lot of diarrhea. States his appetite is decreased.  States he has not eaten well in the last 3 weeks.

## 2016-05-23 LAB — CHROMOGRANIN A: CHROMOGRANIN A: 125 nmol/L — AB (ref 0–5)

## 2016-05-28 ENCOUNTER — Telehealth: Payer: Self-pay | Admitting: *Deleted

## 2016-05-28 NOTE — Telephone Encounter (Signed)
States her father is going downhill rapidly wanted to know what is going on. 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 She thanked me for calling her back and speaking to her

## 2016-05-28 NOTE — Telephone Encounter (Signed)
I can see patient sooner if needed.  He likely will need chemo as well.  Thanks.

## 2016-05-30 ENCOUNTER — Encounter (HOSPITAL_COMMUNITY): Payer: Medicare Other

## 2016-05-30 ENCOUNTER — Encounter
Admission: RE | Admit: 2016-05-30 | Discharge: 2016-05-30 | Disposition: A | Discharge status: Home / Self Care | Payer: Medicare Other | Source: Ambulatory Visit | Attending: Oncology | Admitting: Oncology

## 2016-05-30 ENCOUNTER — Inpatient Hospital Stay (HOSPITAL_BASED_OUTPATIENT_CLINIC_OR_DEPARTMENT_OTHER): Payer: Medicare Other | Admitting: Oncology

## 2016-05-30 VITALS — BP 166/67 | HR 76 | Temp 95.0°F | Resp 20 | Wt 131.0 lb

## 2016-05-30 DIAGNOSIS — C7A1 Malignant poorly differentiated neuroendocrine tumors: Secondary | ICD-10-CM | POA: Insufficient documentation

## 2016-05-30 DIAGNOSIS — R978 Other abnormal tumor markers: Secondary | ICD-10-CM | POA: Diagnosis not present

## 2016-05-30 DIAGNOSIS — R7989 Other specified abnormal findings of blood chemistry: Secondary | ICD-10-CM

## 2016-05-30 DIAGNOSIS — C787 Secondary malignant neoplasm of liver and intrahepatic bile duct: Secondary | ICD-10-CM | POA: Diagnosis not present

## 2016-05-30 DIAGNOSIS — Z8546 Personal history of malignant neoplasm of prostate: Secondary | ICD-10-CM

## 2016-05-30 DIAGNOSIS — Z79899 Other long term (current) drug therapy: Secondary | ICD-10-CM

## 2016-05-30 LAB — GLUCOSE, CAPILLARY
Glucose-Capillary: 195 mg/dL — ABNORMAL HIGH (ref 65–99)
Glucose-Capillary: 187 mg/dL — ABNORMAL HIGH (ref 65–99)

## 2016-05-30 MED ORDER — FENTANYL 25 MCG/HR TD PT72: 25.0000 ug | MEDICATED_PATCH | TRANSDERMAL | 0 refills | Status: AC

## 2016-05-30 MED ORDER — OXYCODONE HCL 10 MG PO TABS: 10.0000 mg | ORAL_TABLET | Freq: Four times a day (QID) | ORAL | 0 refills | Status: AC | PRN

## 2016-05-30 MED ORDER — FLUDEOXYGLUCOSE F - 18 (FDG) INJECTION
12.7600 | Freq: Once | INTRAVENOUS | Status: AC | PRN
  Administered 2016-05-30: 12.76 via INTRAVENOUS

## 2016-05-31 ENCOUNTER — Telehealth: Payer: Self-pay | Admitting: *Deleted

## 2016-05-31 NOTE — Telephone Encounter (Signed)
Called Jonathon Benjamin to clarify appts. Jonathon Benjamin was very confused stating that Dr. Thompson Caul office called to give him appt information. Informed Jonathon Benjamin that it was most likely Dr. Nino Parsley office calling him to set up his port placement. All other appts for consult with [place], chemo class, PET, and first treatment were reviewed with patient. Jonathon Benjamin was instructed to call Dr. Nino Parsley office to get appt information. Jonathon Benjamin verbalized understanding.

## 2016-06-02 MED ORDER — LIDOCAINE-PRILOCAINE 2.5-2.5 % EX CREA
TOPICAL_CREAM | CUTANEOUS | 3 refills | Status: DC
Start: 2016-06-02 — End: 2016-06-07

## 2016-06-02 MED ORDER — ONDANSETRON HCL 8 MG PO TABS
8.0000 mg | ORAL_TABLET | Freq: Two times a day (BID) | ORAL | 1 refills | Status: DC | PRN
Start: 2016-06-02 — End: 2016-06-07

## 2016-06-02 MED ORDER — PROCHLORPERAZINE MALEATE 10 MG PO TABS: 10.0000 mg | ORAL_TABLET | Freq: Four times a day (QID) | ORAL | 1 refills | Status: DC | PRN

## 2016-06-02 NOTE — Progress Notes (Signed)
START ON PATHWAY REGIMEN -      

## 2016-06-02 NOTE — Progress Notes (Signed)
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  INTERVAL HISTORY: Patient returns to clinic today as an add-on for increasing pain. He is unable to tolerate his PET scan earlier today. Patient's performance status is declining. He continues to have a poor appetite and increased nausea. He states his diarrhea has improved since receiving octreotide. He has no neurologic complaints. He denies any recent fevers or illnesses. He has no chest pain or shortness of breath. He has no melena or hematochezia. He has no urinary complaints. Patient offers no further specific complaints today.  REVIEW OF SYSTEMS:   Review of Systems  Constitutional: Positive for malaise/fatigue and weight loss. Negative for fever.  Respiratory: Negative.  Negative for cough and shortness of breath.   Cardiovascular: Negative.  Negative for chest pain and leg swelling.  Gastrointestinal: Positive for abdominal pain, diarrhea and nausea. Negative for blood in stool, constipation, melena and vomiting.  Genitourinary: Negative.   Musculoskeletal: Negative.   Neurological: Positive for weakness.  Psychiatric/Behavioral: Negative.  The patient is not nervous/anxious.     As per HPI. Otherwise, a complete review of systems is negative.  PAST MEDICAL HISTORY: Past Medical History:  Diagnosis Date  . Hypertension   . Prostate cancer ([place])   . Skin cancer   . Stomach cancer ([place])   . Stroke [facility])     PAST SURGICAL HISTORY: Past Surgical History:  Procedure Laterality Date  . VASCULAR SURGERY      FAMILY HISTORY: Family History  Problem Relation Age of  Onset  . Cancer Maternal Aunt   . Heart attack Maternal Grandfather        ADVANCED DIRECTIVES (Y/N):  N   HEALTH MAINTENANCE: Social History  Substance Use Topics  . Smoking status: Former Smoker    Quit date: 05/07/2008  . Smokeless tobacco: Not on file  . Alcohol use No     Colonoscopy:  PAP:  Bone density:  Lipid panel:  No Known Allergies  Current Outpatient Prescriptions  Medication Sig Dispense Refill  . amLODipine (NORVASC) 5 MG tablet Take 5 mg by mouth daily.     Marland Kitchen aspirin EC 81 MG tablet Take 81 mg by mouth daily.     . benazepril (LOTENSIN) 20 MG tablet Take 20 mg by mouth daily.     . citalopram (CELEXA) 20 MG tablet Take by mouth.    . diphenoxylate-atropine (LOMOTIL) 2.5-0.025 MG tablet Take 1 tablet by mouth 4 (four) times daily as needed for diarrhea or loose stools. 30 tablet 0  . Loratadine 10 MG CAPS Take 10 mg by mouth.     . ondansetron (ZOFRAN) 8 MG tablet Take 1 tablet (8 mg total) by mouth every 8 (eight) hours as needed for nausea or vomiting. 30 tablet 2  . promethazine (PHENERGAN) 25 MG tablet Take 1 tablet (25 mg total) by mouth every 6 (six) hours as needed for nausea or vomiting. 30 tablet 0  . promethazine (PHENERGAN) 25 MG tablet Take 1 tablet (25 mg total) by mouth every 6 (six) hours as needed for nausea or vomiting. 30 tablet 0  . simvastatin (ZOCOR) 20 MG tablet Take 20 mg by mouth daily.     Marland Kitchen  fentaNYL (DURAGESIC - DOSED MCG/HR) 25 MCG/HR patch Place 1 patch (25 mcg total) onto the skin every 3 (three) days. 10 patch 0  . megestrol (MEGACE) 40 MG tablet Take 1 tablet (40 mg total) by mouth daily. (Patient not taking: Reported on 05/30/2016) 30 tablet 3  . Oxycodone HCl 10 MG TABS Take 1-2 tablets (10-20 mg total) by mouth every 6 (six) hours as needed. 90 tablet 0   No current facility-administered medications for this visit.     OBJECTIVE: Vitals:   05/30/16 1117  BP: (!) 166/67  Pulse: 76  Resp: 20  Temp: (!) 95 F (35 C)      Body mass index is 21.14 kg/m.    ECOG FS:0 - Asymptomatic  General: Well-developed, well-nourished, no acute distress. Eyes: Pink conjunctiva, anicteric sclera. Lungs: Clear to auscultation bilaterally. Heart: Regular rate and rhythm. No rubs, murmurs, or gallops. Abdomen: Soft, nontender, mildly distended, palpable mass noted in left lobe of liver.  Musculoskeletal: No edema, cyanosis, or clubbing. Neuro: Alert, answering all questions appropriately. Cranial nerves grossly intact. Skin: No rashes or petechiae noted. Psych: Normal affect.   LAB RESULTS:  Lab Results  Component Value Date   NA 135 04/23/2016   K 4.7 04/23/2016   CL 102 04/23/2016   CO2 25 04/23/2016   GLUCOSE 105 (H) 04/23/2016   BUN 17 04/23/2016   CREATININE 1.29 (H) 04/23/2016   CALCIUM 11.4 (H) 04/23/2016   PROT 8.2 (H) 04/23/2016   ALBUMIN 3.5 04/23/2016   AST 32 04/23/2016   ALT 16 (L) 04/23/2016   ALKPHOS 288 (H) 04/23/2016   BILITOT 0.4 04/23/2016   GFRNONAA 53 (L) 04/23/2016   GFRAA >60 04/23/2016    Lab Results  Component Value Date   WBC 9.2 05/07/2016   NEUTROABS 6.9 (H) 04/23/2016   HGB 12.2 (L) 05/07/2016   HCT 35.5 (L) 05/07/2016   MCV 80.8 05/07/2016   PLT 510 (H) 05/07/2016    STUDIES: Nm Pet Image Initial (pi) Skull Base To Thigh  Result Date: 05/31/2016 CLINICAL DATA:  Initial treatment strategy for malignant neuroendocrine carcinoma in liver. EXAM: NUCLEAR MEDICINE PET SKULL BASE TO THIGH TECHNIQUE: 12.8 mCi F-18 FDG was injected intravenously. CT data was successfully obtained and used for attenuation correction and anatomic localization from the skullbase through the upper thighs. Full-ring PET imaging was performed beginning at the pelvis. PET images were obtained through the pelvis, however the patient was in severe pain and could not complete the scan. PET images were not obtained through the neck, chest, or abdomen FASTING BLOOD GLUCOSE:  Value: 187 mg/dl COMPARISON:  AP CT  on 04/15/2016 FINDINGS: NECK No PET images were obtained through the neck, as described above. Noncontrast CT images through the neck show no evidence of masses or lymphadenopathy. CHEST No PET images were obtained through the chest, as described above. Noncontrast CT images through the chest show several sub-cm mediastinal lymph nodes in the left paratracheal region, largest measuring 7 mm on image 87/3. No pathologically enlarged lymph nodes identified on this unenhanced exam. Aortic atherosclerosis and coronary artery calcification noted. Moderate emphysema is noted. No suspicious pulmonary nodules or masses identified. No evidence of pulmonary infiltrate or pleural effusion. ABDOMEN/PELVIS No PET images were obtained through the abdomen, as described above. Noncontrast CT images through the abdomen show several low-attenuation liver masses, largest involving both the right and left hepatic lobes which measures approximately 14.9 x 14.4 cm on image 173/3 compared to 13.5 by 12.6 cm previously.  Small low-attenuation lesions in right and left hepatic lobe measuring 1-2 cm also appear new or increased since previous study. Bilateral adrenal masses show mild increase in size, with right adrenal mass measuring 3.7 cm compared to 2.7 cm previously and left adrenal mass measuring 3.3 cm compared to 2.6 cm previously. New mild abdominal retroperitoneal lymphadenopathy is seen, with largest lymph node measuring 1.5 cm in the aortocaval space on image 177/3. Chronic right renal atrophy noted. Bilateral renal cysts and nonobstructive renal calculi remains stable. No evidence of hydronephrosis. PET images were obtained through the pelvis, and no pelvic hypermetabolic soft tissue masses or lymphadenopathy are identified. SKELETON PET images through pelvis show multiple hypermetabolic bone metastases involving the sacrum, pelvis, and bilateral proximal femurs, which are not visualized on corresponding CT images. Index lesion  in the sacrum has SUV max of 6.8. IMPRESSION: Noncontrast CT images were obtained from the neck through the upper thighs, however PET images were only obtained through the pelvis due to patient's severe pain and inability to complete the scan. Noncontrast CT images show mild progression of liver metastases, mild increased size of bilateral adrenal masses, and new mild abdominal retroperitoneal lymphadenopathy, consistent with metastatic disease. Several sub-cm mediastinal lymph nodes also noted in left paratracheal region, which are not pathologically enlarged. Pelvic PET images show multiple hypermetabolic bone metastases in the pelvis and proximal femurs. These are not visible on corresponding CT images. Electronically Signed   By: Earle Gell M.D.   On: 05/31/2016 09:38   US Biopsy  Result Date: 05/07/2016 INDICATION: Liver lesions EXAM: ULTRASOUND-GUIDED BIOPSY OF A LARGE LIVER MASS. MEDICATIONS: None. ANESTHESIA/SEDATION: Fentanyl 50 mcg IV; Versed 1 mg IV Moderate Sedation Time:  10 minutes The patient was continuously monitored during the procedure by the interventional radiology nurse under my direct supervision. FLUOROSCOPY TIME:  Fluoroscopy Time:  minutes  seconds ( mGy). COMPLICATIONS: None immediate. PROCEDURE: Informed written consent was obtained from the patient after a thorough discussion of the procedural risks, benefits and alternatives. All questions were addressed. Maximal Sterile Barrier Technique was utilized including caps, mask, sterile gowns, sterile gloves, sterile drape, hand hygiene and skin antiseptic. A timeout was performed prior to the initiation of the procedure. The epigastrium was prepped with ChloraPrep in a sterile fashion, and a sterile drape was applied covering the operative field. A sterile gown and sterile gloves were used for the procedure. Under sonographic guidance, an 17 gauge guide needle was advanced into the large liver mass. Subsequently 3 18 gauge core biopsies  were obtained. Gel-Foam slurry was injected into the needle tract. The guide needle was removed. Final imaging was performed. Patient tolerated the procedure well without complication. Vital sign monitoring by nursing staff during the procedure will continue as patient is in the special procedures unit for post procedure observation. FINDINGS: The images document guide needle placement within the large liver mass. Post biopsy images demonstrate no hemorrhage. IMPRESSION: Successful ultrasound-guided core biopsy of a large liver mass. Electronically Signed   By: Marybelle Killings M.D.   On: 05/07/2016 12:11    ASSESSMENT: Liver masses  PLAN:    1. Liver masses: Highly suspicious for underlying malignancy, possibly neuroendocrine origin. CT scan results from April 15, 2016 reviewed independently and reported as above.  Patient has a significantly elevated AFP at 152 and a mildly elevated CA-19-9 at 68, but his CEA and PSA are within normal limits. Biopsy results continue to be pending at time of dictation. We will call patient with results are  finalized to arrange further follow-up to discuss treatment planning. 2. Hypercalcemia: Likely secondary to underlying malignancy, monitor. 3. Diarrhea: Patient was given a prescription for Lomotil. 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 4. Nausea: Patient states ondansetron did not work and was given a prescription for Phenergan today.   Approximately 30 minutes was spent in discussion of which greater than 50% was consultation.  Patient expressed understanding and was in agreement with this plan. He also understands that He can call clinic at any time with any questions, concerns, or complaints.   No matching staging information was found for the patient.  Lloyd Huger, MD   06/02/2016 8:40 AM     [place]  Telephone:(336) [phone] Fax:(336) [phone]  ID: Jonathon Benjamin OB: 1941-09-02   MR#: [id number]  [id number]  Patient Care Team: Sherrin Daisy, MD as PCP - General (Family Medicine) Clent Jacks, RN as Registered Nurse Katha Cabal, MD as Consulting Physician (Vascular Surgery)  CHIEF COMPLAINT: Liver masses  INTERVAL HISTORY: Patient is 75 year old male who is undergoing ultrasound to evaluate his aorta by vascular surgery and incidentally noted to have multiple liver masses. Subsequent CT scan revealed a 14 cm mass as well as a 7 cm mass highly suspicious for underlying malignancy. Currently, patient feels well and is asymptomatic. He denies any pain. He has no neurologic complaints. He denies any recent fevers or illnesses. He has a good appetite and denies weight loss. He has no chest pain or shortness of breath. He denies any nausea, vomiting, constipation, or diarrhea. He has no melena or hematochezia. He has no urinary complaints. Patient offers no specific complaints today.  REVIEW OF SYSTEMS:   Review of Systems  Constitutional: Negative.  Negative for fever, malaise/fatigue and weight loss.  Respiratory: Negative.  Negative for cough and shortness of breath.   Cardiovascular: Negative.  Negative for chest pain and leg swelling.  Gastrointestinal: Negative.  Negative for abdominal pain, blood in stool, constipation, diarrhea, melena, nausea and vomiting.  Genitourinary: Negative.   Neurological: Negative.  Negative for weakness.  Psychiatric/Behavioral: Negative.     As per HPI. Otherwise, a complete review of systems is negatve.  PAST MEDICAL HISTORY: Past Medical History:  Diagnosis Date  . Hypertension   . Prostate cancer ([place])   . Skin cancer   . Stomach cancer ([place])   . Stroke [facility])     PAST SURGICAL HISTORY: Past Surgical History:  Procedure Laterality Date  . VASCULAR SURGERY      FAMILY HISTORY: Family History  Problem Relation Age of Onset  . Cancer Maternal Aunt   . Heart attack Maternal Grandfather        ADVANCED  DIRECTIVES (Y/N):  N   HEALTH MAINTENANCE: Social History  Substance Use Topics  . Smoking status: Former Smoker    Quit date: 05/07/2008  . Smokeless tobacco: Not on file  . Alcohol use No     Colonoscopy:  PAP:  Bone density:  Lipid panel:  No Known Allergies  Current Outpatient Prescriptions  Medication Sig Dispense Refill  . amLODipine (NORVASC) 5 MG tablet Take 5 mg by mouth daily.     Marland Kitchen aspirin EC 81 MG tablet Take 81 mg by mouth daily.     . benazepril (LOTENSIN) 20 MG tablet Take 20 mg by mouth daily.     . citalopram (CELEXA) 20 MG tablet Take by mouth.    . diphenoxylate-atropine (LOMOTIL) 2.5-0.025 MG tablet Take 1  tablet by mouth 4 (four) times daily as needed for diarrhea or loose stools. 30 tablet 0  . Loratadine 10 MG CAPS Take 10 mg by mouth.     . ondansetron (ZOFRAN) 8 MG tablet Take 1 tablet (8 mg total) by mouth every 8 (eight) hours as needed for nausea or vomiting. 30 tablet 2  . promethazine (PHENERGAN) 25 MG tablet Take 1 tablet (25 mg total) by mouth every 6 (six) hours as needed for nausea or vomiting. 30 tablet 0  . promethazine (PHENERGAN) 25 MG tablet Take 1 tablet (25 mg total) by mouth every 6 (six) hours as needed for nausea or vomiting. 30 tablet 0  . simvastatin (ZOCOR) 20 MG tablet Take 20 mg by mouth daily.     . fentaNYL (DURAGESIC - DOSED MCG/HR) 25 MCG/HR patch Place 1 patch (25 mcg total) onto the skin every 3 (three) days. 10 patch 0  . megestrol (MEGACE) 40 MG tablet Take 1 tablet (40 mg total) by mouth daily. (Patient not taking: Reported on 05/30/2016) 30 tablet 3  . Oxycodone HCl 10 MG TABS Take 1-2 tablets (10-20 mg total) by mouth every 6 (six) hours as needed. 90 tablet 0   No current facility-administered medications for this visit.     OBJECTIVE: Vitals:   05/30/16 1117  BP: (!) 166/67  Pulse: 76  Resp: 20  Temp: (!) 95 F (35 C)     Body mass index is 21.14 kg/m.    ECOG FS:0 - Asymptomatic  General: Well-developed,  well-nourished, no acute distress. Eyes: Pink conjunctiva, anicteric sclera. HEENT: Normocephalic, moist mucous membranes, clear oropharnyx. Lungs: Clear to auscultation bilaterally. Heart: Regular rate and rhythm. No rubs, murmurs, or gallops. Abdomen: Soft, nontender, mildly distended, palpable mass noted in left lobe of liver.  Musculoskeletal: No edema, cyanosis, or clubbing. Neuro: Alert, answering all questions appropriately. Cranial nerves grossly intact. Skin: No rashes or petechiae noted. Psych: Normal affect. Lymphatics: No cervical, calvicular, axillary or inguinal LAD.   LAB RESULTS:  Lab Results  Component Value Date   NA 135 04/23/2016   K 4.7 04/23/2016   CL 102 04/23/2016   CO2 25 04/23/2016   GLUCOSE 105 (H) 04/23/2016   BUN 17 04/23/2016   CREATININE 1.29 (H) 04/23/2016   CALCIUM 11.4 (H) 04/23/2016   PROT 8.2 (H) 04/23/2016   ALBUMIN 3.5 04/23/2016   AST 32 04/23/2016   ALT 16 (L) 04/23/2016   ALKPHOS 288 (H) 04/23/2016   BILITOT 0.4 04/23/2016   GFRNONAA 53 (L) 04/23/2016   GFRAA >60 04/23/2016    Lab Results  Component Value Date   WBC 9.2 05/07/2016   NEUTROABS 6.9 (H) 04/23/2016   HGB 12.2 (L) 05/07/2016   HCT 35.5 (L) 05/07/2016   MCV 80.8 05/07/2016   PLT 510 (H) 05/07/2016    STUDIES: Nm Pet Image Initial (pi) Skull Base To Thigh  Result Date: 05/31/2016 CLINICAL DATA:  Initial treatment strategy for malignant neuroendocrine carcinoma in liver. EXAM: NUCLEAR MEDICINE PET SKULL BASE TO THIGH TECHNIQUE: 12.8 mCi F-18 FDG was injected intravenously. CT data was successfully obtained and used for attenuation correction and anatomic localization from the skullbase through the upper thighs. Full-ring PET imaging was performed beginning at the pelvis. PET images were obtained through the pelvis, however the patient was in severe pain and could not complete the scan. PET images were not obtained through the neck, chest, or abdomen FASTING BLOOD  GLUCOSE:  Value: 187 mg/dl COMPARISON:  AP CT on  04/15/2016 FINDINGS: NECK No PET images were obtained through the neck, as described above. Noncontrast CT images through the neck show no evidence of masses or lymphadenopathy. CHEST No PET images were obtained through the chest, as described above. Noncontrast CT images through the chest show several sub-cm mediastinal lymph nodes in the left paratracheal region, largest measuring 7 mm on image 87/3. No pathologically enlarged lymph nodes identified on this unenhanced exam. Aortic atherosclerosis and coronary artery calcification noted. Moderate emphysema is noted. No suspicious pulmonary nodules or masses identified. No evidence of pulmonary infiltrate or pleural effusion. ABDOMEN/PELVIS No PET images were obtained through the abdomen, as described above. Noncontrast CT images through the abdomen show several low-attenuation liver masses, largest involving both the right and left hepatic lobes which measures approximately 14.9 x 14.4 cm on image 173/3 compared to 13.5 by 12.6 cm previously. Small low-attenuation lesions in right and left hepatic lobe measuring 1-2 cm also appear new or increased since previous study. Bilateral adrenal masses show mild increase in size, with right adrenal mass measuring 3.7 cm compared to 2.7 cm previously and left adrenal mass measuring 3.3 cm compared to 2.6 cm previously. New mild abdominal retroperitoneal lymphadenopathy is seen, with largest lymph node measuring 1.5 cm in the aortocaval space on image 177/3. Chronic right renal atrophy noted. Bilateral renal cysts and nonobstructive renal calculi remains stable. No evidence of hydronephrosis. PET images were obtained through the pelvis, and no pelvic hypermetabolic soft tissue masses or lymphadenopathy are identified. SKELETON PET images through pelvis show multiple hypermetabolic bone metastases involving the sacrum, pelvis, and bilateral proximal femurs, which are not  visualized on corresponding CT images. Index lesion in the sacrum has SUV max of 6.8. IMPRESSION: Noncontrast CT images were obtained from the neck through the upper thighs, however PET images were only obtained through the pelvis due to patient's severe pain and inability to complete the scan. Noncontrast CT images show mild progression of liver metastases, mild increased size of bilateral adrenal masses, and new mild abdominal retroperitoneal lymphadenopathy, consistent with metastatic disease. Several sub-cm mediastinal lymph nodes also noted in left paratracheal region, which are not pathologically enlarged. Pelvic PET images show multiple hypermetabolic bone metastases in the pelvis and proximal femurs. These are not visible on corresponding CT images. Electronically Signed   By: Earle Gell M.D.   On: 05/31/2016 09:38   US Biopsy  Result Date: 05/07/2016 INDICATION: Liver lesions EXAM: ULTRASOUND-GUIDED BIOPSY OF A LARGE LIVER MASS. MEDICATIONS: None. ANESTHESIA/SEDATION: Fentanyl 50 mcg IV; Versed 1 mg IV Moderate Sedation Time:  10 minutes The patient was continuously monitored during the procedure by the interventional radiology nurse under my direct supervision. FLUOROSCOPY TIME:  Fluoroscopy Time:  minutes  seconds ( mGy). COMPLICATIONS: None immediate. PROCEDURE: Informed written consent was obtained from the patient after a thorough discussion of the procedural risks, benefits and alternatives. All questions were addressed. Maximal Sterile Barrier Technique was utilized including caps, mask, sterile gowns, sterile gloves, sterile drape, hand hygiene and skin antiseptic. A timeout was performed prior to the initiation of the procedure. The epigastrium was prepped with ChloraPrep in a sterile fashion, and a sterile drape was applied covering the operative field. A sterile gown and sterile gloves were used for the procedure. Under sonographic guidance, an 17 gauge guide needle was advanced into the  large liver mass. Subsequently 3 18 gauge core biopsies were obtained. Gel-Foam slurry was injected into the needle tract. The guide needle was removed. Final imaging was performed. Patient  tolerated the procedure well without complication. Vital sign monitoring by nursing staff during the procedure will continue as patient is in the special procedures unit for post procedure observation. FINDINGS: The images document guide needle placement within the large liver mass. Post biopsy images demonstrate no hemorrhage. IMPRESSION: Successful ultrasound-guided core biopsy of a large liver mass. Electronically Signed   By: Marybelle Killings M.D.   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  PLAN:    1. 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 Case discussed with pathology. CT scan results from April 15, 2016 reviewed independently with multiple liver lesions consistent with neuroendocrine carcinoma. Patient was only able to tolerate part of his PET scan. This was reviewed independently revealing bilateral adrenal masses as well as retroperitoneal lymphadenopathy consistent with metastatic disease.  Patient has a significantly elevated AFP at 152 and a mildly elevated CA-19-9 at 68, but his CEA and PSA are within normal limits. Chromogranin A levels are significantly elevated at 125. 5-HIAA 24 hour urine collection results are pending. Although patient's performance status is declining, he wishes to attempt palliative chemotherapy using carboplatinum and etoposide. Patient will require a port placement prior to initiating treatment. We will also attempt to reschedule his PET scan. Return to clinic on June 12, 2016 to initiate cycle 1, day 1 of carboplatinum and etoposide. Patient will receive etoposide only on days 2 and 3.  Referral also has been made to GI for consideration of colonoscopy to assess for  a primary lesion. 2. Diarrhea: Improved. Patient received 30 mg IM octreotide on May 20, 2016.  Approximately 30 minutes was spent in discussion of which greater than 50% was consultation.  Patient expressed understanding and was in agreement with this plan. He also understands that He can call clinic at any time with any questions, concerns, or complaints.   No matching staging information was found for the patient.  Lloyd Huger, MD   06/02/2016 8:40 AM

## 2016-06-03 ENCOUNTER — Ambulatory Visit: Payer: Medicare Other | Admitting: Oncology

## 2016-06-04 ENCOUNTER — Other Ambulatory Visit: Payer: Medicare Other

## 2016-06-04 ENCOUNTER — Ambulatory Visit: Payer: Medicare Other | Admitting: Oncology

## 2016-06-04 ENCOUNTER — Inpatient Hospital Stay: Payer: Medicare Other | Admitting: Oncology

## 2016-06-05 NOTE — Patient Instructions (Signed)
Carboplatin injection  What is this medicine?  CARBOPLATIN (KAR boe pla tin) is a chemotherapy drug. It targets fast dividing cells, like cancer cells, and causes these cells to die. This medicine is used to treat ovarian cancer and many other cancers.  This medicine may be used for other purposes; ask your health care provider or pharmacist if you have questions.  What should I tell my health care provider before I take this medicine?  They need to know if you have any of these conditions:  -blood disorders  -hearing problems  -kidney disease  -recent or ongoing radiation therapy  -an unusual or allergic reaction to carboplatin, cisplatin, other chemotherapy, other medicines, foods, dyes, or preservatives  -pregnant or trying to get pregnant  -breast-feeding  How should I use this medicine?  This drug is usually given as an infusion into a vein. It is administered in a hospital or clinic by a specially trained health care professional.  Talk to your pediatrician regarding the use of this medicine in children. Special care may be needed.  Overdosage: If you think you have taken too much of this medicine contact a poison control center or emergency room at once.  NOTE: This medicine is only for you. Do not share this medicine with others.  What if I miss a dose?  It is important not to miss a dose. Call your doctor or health care professional if you are unable to keep an appointment.  What may interact with this medicine?  -medicines for seizures  -medicines to increase blood counts like filgrastim, pegfilgrastim, sargramostim  -some antibiotics like amikacin, gentamicin, neomycin, streptomycin, tobramycin  -vaccines  Talk to your doctor or health care professional before taking any of these medicines:  -acetaminophen  -aspirin  -ibuprofen  -ketoprofen  -naproxen  This list may not describe all possible interactions. Give your health care provider a list of all the medicines, herbs, non-prescription drugs, or dietary  supplements you use. Also tell them if you smoke, drink alcohol, or use illegal drugs. Some items may interact with your medicine.  What should I watch for while using this medicine?  Your condition will be monitored carefully while you are receiving this medicine. You will need important blood work done while you are taking this medicine.  This drug may make you feel generally unwell. This is not uncommon, as chemotherapy can affect healthy cells as well as cancer cells. Report any side effects. Continue your course of treatment even though you feel ill unless your doctor tells you to stop.  In some cases, you may be given additional medicines to help with side effects. Follow all directions for their use.  Call your doctor or health care professional for advice if you get a fever, chills or sore throat, or other symptoms of a cold or flu. Do not treat yourself. This drug decreases your body's ability to fight infections. Try to avoid being around people who are sick.  This medicine may increase your risk to bruise or bleed. Call your doctor or health care professional if you notice any unusual bleeding.  Be careful brushing and flossing your teeth or using a toothpick because you may get an infection or bleed more easily. If you have any dental work done, tell your dentist you are receiving this medicine.  Avoid taking products that contain aspirin, acetaminophen, ibuprofen, naproxen, or ketoprofen unless instructed by your doctor. These medicines may hide a fever.  Do not become pregnant while taking this medicine.   Do not breast-feed an infant while taking this medicine. What side effects may I notice from receiving this medicine? Side effects that you should report to your  doctor or health care professional as soon as possible: -allergic reactions like skin rash, itching or hives, swelling of the face, lips, or tongue -signs of infection - fever or chills, cough, sore throat, pain or difficulty passing urine -signs of decreased platelets or bleeding - bruising, pinpoint red spots on the skin, black, tarry stools, nosebleeds -signs of decreased red blood cells - unusually weak or tired, fainting spells, lightheadedness -breathing problems -changes in hearing -changes in vision -chest pain -high blood pressure -low blood counts - This drug may decrease the number of white blood cells, red blood cells and platelets. You may be at increased risk for infections and bleeding. -nausea and vomiting -pain, swelling, redness or irritation at the injection site -pain, tingling, numbness in the hands or feet -problems with balance, talking, walking -trouble passing urine or change in the amount of urine Side effects that usually do not require medical attention (report to your doctor or health care professional if they continue or are bothersome): -hair loss -loss of appetite -metallic taste in the mouth or changes in taste This list may not describe all possible side effects. Call your doctor for medical advice about side effects. You may report side effects to FDA at 1-800-FDA-1088. Where should I keep my medicine? This drug is given in a hospital or clinic and will not be stored at home. NOTE: This sheet is a summary. It may not cover all possible information. If you have questions about this medicine, talk to your doctor, pharmacist, or health care provider.    2016, [employer]. (2007-11-24 14:38:05) Carboplatin injection What is this medicine? CARBOPLATIN (KAR boe pla tin) is a chemotherapy drug. It targets fast dividing cells, like cancer cells, and causes these cells to die. This medicine is used to treat ovarian cancer and many other cancers. This  medicine may be used for other purposes; ask your health care provider or pharmacist if you have questions. What should I tell my health care provider before I take this medicine? They need to know if you have any of these conditions: -blood disorders -hearing problems -kidney disease -recent or ongoing radiation therapy -an unusual or allergic reaction to carboplatin, cisplatin, other chemotherapy, other medicines, foods, dyes, or preservatives -pregnant or trying to get pregnant -breast-feeding How should I use this medicine? This drug is usually given as an infusion into a vein. It is administered in a hospital or clinic by a specially trained health care professional. Talk to your pediatrician regarding the use of this medicine in children. Special care may be needed. Overdosage: If you think you have taken too much of this medicine contact a poison control center or emergency room at once. NOTE: This medicine is only for you. Do not share this medicine with others. What if I miss a dose? It is important not to miss a dose. Call your doctor or health care professional if you are unable to keep an appointment. What may interact with this medicine? -medicines for seizures -medicines to increase blood counts like filgrastim, pegfilgrastim, sargramostim -some antibiotics like amikacin, gentamicin, neomycin, streptomycin, tobramycin -vaccines Talk to your doctor or health care professional before taking any of these medicines: -acetaminophen -aspirin -ibuprofen -ketoprofen -naproxen This list may not describe all possible interactions. Give your health care provider a list of all the medicines, herbs, non-prescription drugs,  or dietary supplements you use. Also tell them if you smoke, drink alcohol, or use illegal drugs. Some items may interact with your medicine. What should I watch for while using this medicine? Your condition will be monitored carefully while you are receiving this  medicine. You will need important blood work done while you are taking this medicine. This drug may make you feel generally unwell. This is not uncommon, as chemotherapy can affect healthy cells as well as cancer cells. Report any side effects. Continue your course of treatment even though you feel ill unless your doctor tells you to stop. In some cases, you may be given additional medicines to help with side effects. Follow all directions for their use. Call your doctor or health care professional for advice if you get a fever, chills or sore throat, or other symptoms of a cold or flu. Do not treat yourself. This drug decreases your body's ability to fight infections. Try to avoid being around people who are sick. This medicine may increase your risk to bruise or bleed. Call your doctor or health care professional if you notice any unusual bleeding. Be careful brushing and flossing your teeth or using a toothpick because you may get an infection or bleed more easily. If you have any dental work done, tell your dentist you are receiving this medicine. Avoid taking products that contain aspirin, acetaminophen, ibuprofen, naproxen, or ketoprofen unless instructed by your doctor. These medicines may hide a fever. Do not become pregnant while taking this medicine. Women should inform their doctor if they wish to become pregnant or think they might be pregnant. There is a potential for serious side effects to an unborn child. Talk to your health care professional or pharmacist for more information. Do not breast-feed an infant while taking this medicine. What side effects may I notice from receiving this medicine? Side effects that you should report to your doctor or health care professional as soon as possible: -allergic reactions like skin rash, itching or hives, swelling of the face, lips, or tongue -signs of infection - fever or chills, cough, sore throat, pain or difficulty passing urine -signs of  decreased platelets or bleeding - bruising, pinpoint red spots on the skin, black, tarry stools, nosebleeds -signs of decreased red blood cells - unusually weak or tired, fainting spells, lightheadedness -breathing problems -changes in hearing -changes in vision -chest pain -high blood pressure -low blood counts - This drug may decrease the number of white blood cells, red blood cells and platelets. You may be at increased risk for infections and bleeding. -nausea and vomiting -pain, swelling, redness or irritation at the injection site -pain, tingling, numbness in the hands or feet -problems with balance, talking, walking -trouble passing urine or change in the amount of urine Side effects that usually do not require medical attention (report to your doctor or health care professional if they continue or are bothersome): -hair loss -loss of appetite -metallic taste in the mouth or changes in taste This list may not describe all possible side effects. Call your doctor for medical advice about side effects. You may report side effects to FDA at 1-800-FDA-1088. Where should I keep my medicine? This drug is given in a hospital or clinic and will not be stored at home. NOTE: This sheet is a summary. It may not cover all possible information. If you have questions about this medicine, talk to your doctor, pharmacist, or health care provider.    2016, [employer]. (2007-11-24 14:38:05) Etoposide,  VP-16 injection What is this medicine? ETOPOSIDE, VP-16 (e toe POE side) is a chemotherapy drug. It is used to treat testicular cancer, lung cancer, and other cancers. This medicine may be used for other purposes; ask your health care provider or pharmacist if you have questions. What should I tell my health care provider before I take this medicine? They need to know if you have any of these conditions: -infection -kidney disease -low blood counts, like low white cell, platelet, or red  cell counts -an unusual or allergic reaction to etoposide, other chemotherapeutic agents, other medicines, foods, dyes, or preservatives -pregnant or trying to get pregnant -breast-feeding How should I use this medicine? This medicine is for infusion into a vein. It is administered in a hospital or clinic by a specially trained health care professional. Talk to your pediatrician regarding the use of this medicine in children. Special care may be needed. Overdosage: If you think you have taken too much of this medicine contact a poison control center or emergency room at once. NOTE: This medicine is only for you. Do not share this medicine with others. What if I miss a dose? It is important not to miss your dose. Call your doctor or health care professional if you are unable to keep an appointment. What may interact with this medicine? -aspirin -certain medications for seizures like carbamazepine, phenobarbital, phenytoin, valproic acid -cyclosporine -levamisole -warfarin This list may not describe all possible interactions. Give your health care provider a list of all the medicines, herbs, non-prescription drugs, or dietary supplements you use. Also tell them if you smoke, drink alcohol, or use illegal drugs. Some items may interact with your medicine. What should I watch for while using this medicine? Visit your doctor for checks on your progress. This drug may make you feel generally unwell. This is not uncommon, as chemotherapy can affect healthy cells as well as cancer cells. Report any side effects. Continue your course of treatment even though you feel ill unless your doctor tells you to stop. In some cases, you may be given additional medicines to help with side effects. Follow all directions for their use. Call your doctor or health care professional for advice if you get a fever, chills or sore throat, or other symptoms of a cold or flu. Do not treat yourself. This drug decreases your  body's ability to fight infections. Try to avoid being around people who are sick. This medicine may increase your risk to bruise or bleed. Call your doctor or health care professional if you notice any unusual bleeding. Be careful brushing and flossing your teeth or using a toothpick because you may get an infection or bleed more easily. If you have any dental work done, tell your dentist you are receiving this medicine. Avoid taking products that contain aspirin, acetaminophen, ibuprofen, naproxen, or ketoprofen unless instructed by your doctor. These medicines may hide a fever. Do not become pregnant while taking this medicine or for at least 6 months after stopping it. Women should inform their doctor if they wish to become pregnant or think they might be pregnant. Women of child-bearing potential will need to have a negative pregnancy test before starting this medicine. There is a potential for serious side effects to an unborn child. Talk to your health care professional or pharmacist for more information. Do not breast-feed an infant while taking this medicine. Men must use a latex condom during sexual contact with a woman while taking this medicine and for at least  4 months after stopping it. A latex condom is needed even if you have had a vasectomy. Contact your doctor right away if your partner becomes pregnant. Do not donate sperm while taking this medicine and for at least 4 months after you stop taking this medicine. Men should inform their doctors if they wish to father a child. This medicine may lower sperm counts. What side effects may I notice from receiving this medicine? Side effects that you should report to your doctor or health care professional as soon as possible: -allergic reactions like skin rash, itching or hives, swelling of the face, lips, or tongue -low blood counts - this medicine may decrease the number of white blood cells, red blood cells and platelets. You may be at  increased risk for infections and bleeding. -signs of infection - fever or chills, cough, sore throat, pain or difficulty passing urine -signs of decreased platelets or bleeding - bruising, pinpoint red spots on the skin, black, tarry stools, blood in the urine -signs of decreased red blood cells - unusually weak or tired, fainting spells, lightheadedness -breathing problems -changes in vision -mouth or throat sores or ulcers -pain, redness, swelling or irritation at the injection site -pain, tingling, numbness in the hands or feet -redness, blistering, peeling or loosening of the skin, including inside the mouth -seizures -vomiting Side effects that usually do not require medical attention (report to your doctor or health care professional if they continue or are bothersome): -diarrhea -hair loss -loss of appetite -nausea -stomach pain This list may not describe all possible side effects. Call your doctor for medical advice about side effects. You may report side effects to FDA at 1-800-FDA-1088. Where should I keep my medicine? This drug is given in a hospital or clinic and will not be stored at home. NOTE: This sheet is a summary. It may not cover all possible information. If you have questions about this medicine, talk to your doctor, pharmacist, or health care provider.    2016, [employer]. (2014-04-14 12:32:50)

## 2016-06-06 ENCOUNTER — Inpatient Hospital Stay: Payer: Medicare Other | Attending: Oncology

## 2016-06-07 ENCOUNTER — Encounter: Payer: Self-pay | Admitting: *Deleted

## 2016-06-07 ENCOUNTER — Inpatient Hospital Stay
Admit: 2016-06-07 | Discharge: 2016-06-07 | Disposition: A | Discharge status: Home / Self Care | Payer: Medicare Other | Attending: Internal Medicine | Admitting: Internal Medicine

## 2016-06-07 ENCOUNTER — Emergency Department: Payer: Medicare Other

## 2016-06-07 ENCOUNTER — Inpatient Hospital Stay
Admission: EM | Admit: 2016-06-07 | Discharge: 2016-06-11 | DRG: 682 | Disposition: A | Discharge status: Hospice | Payer: Medicare Other | Attending: Internal Medicine | Admitting: Internal Medicine

## 2016-06-07 ENCOUNTER — Inpatient Hospital Stay: Payer: Medicare Other

## 2016-06-07 DIAGNOSIS — M6281 Muscle weakness (generalized): Secondary | ICD-10-CM

## 2016-06-07 DIAGNOSIS — Z7982 Long term (current) use of aspirin: Secondary | ICD-10-CM | POA: Diagnosis not present

## 2016-06-07 DIAGNOSIS — N170 Acute kidney failure with tubular necrosis: Secondary | ICD-10-CM | POA: Diagnosis present

## 2016-06-07 DIAGNOSIS — C7971 Secondary malignant neoplasm of right adrenal gland: Secondary | ICD-10-CM | POA: Diagnosis present

## 2016-06-07 DIAGNOSIS — Z85028 Personal history of other malignant neoplasm of stomach: Secondary | ICD-10-CM

## 2016-06-07 DIAGNOSIS — R627 Adult failure to thrive: Secondary | ICD-10-CM | POA: Diagnosis present

## 2016-06-07 DIAGNOSIS — S22080A Wedge compression fracture of T11-T12 vertebra, initial encounter for closed fracture: Secondary | ICD-10-CM | POA: Diagnosis present

## 2016-06-07 DIAGNOSIS — J320 Chronic maxillary sinusitis: Secondary | ICD-10-CM | POA: Diagnosis present

## 2016-06-07 DIAGNOSIS — W19XXXA Unspecified fall, initial encounter: Secondary | ICD-10-CM | POA: Diagnosis present

## 2016-06-07 DIAGNOSIS — Z79818 Long term (current) use of other agents affecting estrogen receptors and estrogen levels: Secondary | ICD-10-CM | POA: Diagnosis not present

## 2016-06-07 DIAGNOSIS — G893 Neoplasm related pain (acute) (chronic): Secondary | ICD-10-CM

## 2016-06-07 DIAGNOSIS — C7972 Secondary malignant neoplasm of left adrenal gland: Secondary | ICD-10-CM | POA: Diagnosis present

## 2016-06-07 DIAGNOSIS — C7951 Secondary malignant neoplasm of bone: Secondary | ICD-10-CM | POA: Diagnosis present

## 2016-06-07 DIAGNOSIS — N179 Acute kidney failure, unspecified: Secondary | ICD-10-CM | POA: Diagnosis not present

## 2016-06-07 DIAGNOSIS — M545 Low back pain: Secondary | ICD-10-CM | POA: Diagnosis present

## 2016-06-07 DIAGNOSIS — D3A8 Other benign neuroendocrine tumors: Secondary | ICD-10-CM | POA: Diagnosis present

## 2016-06-07 DIAGNOSIS — Z79899 Other long term (current) drug therapy: Secondary | ICD-10-CM

## 2016-06-07 DIAGNOSIS — G8929 Other chronic pain: Secondary | ICD-10-CM | POA: Diagnosis present

## 2016-06-07 DIAGNOSIS — R1110 Vomiting, unspecified: Secondary | ICD-10-CM

## 2016-06-07 DIAGNOSIS — R197 Diarrhea, unspecified: Secondary | ICD-10-CM | POA: Diagnosis present

## 2016-06-07 DIAGNOSIS — I10 Essential (primary) hypertension: Secondary | ICD-10-CM | POA: Diagnosis present

## 2016-06-07 DIAGNOSIS — I248 Other forms of acute ischemic heart disease: Secondary | ICD-10-CM | POA: Diagnosis present

## 2016-06-07 DIAGNOSIS — Z85828 Personal history of other malignant neoplasm of skin: Secondary | ICD-10-CM

## 2016-06-07 DIAGNOSIS — E43 Unspecified severe protein-calorie malnutrition: Secondary | ICD-10-CM | POA: Diagnosis present

## 2016-06-07 DIAGNOSIS — Z7189 Other specified counseling: Secondary | ICD-10-CM

## 2016-06-07 DIAGNOSIS — Z515 Encounter for palliative care: Secondary | ICD-10-CM

## 2016-06-07 DIAGNOSIS — Z66 Do not resuscitate: Secondary | ICD-10-CM

## 2016-06-07 DIAGNOSIS — C787 Secondary malignant neoplasm of liver and intrahepatic bile duct: Secondary | ICD-10-CM | POA: Diagnosis present

## 2016-06-07 DIAGNOSIS — Z6821 Body mass index (BMI) 21.0-21.9, adult: Secondary | ICD-10-CM

## 2016-06-07 DIAGNOSIS — A419 Sepsis, unspecified organism: Secondary | ICD-10-CM

## 2016-06-07 DIAGNOSIS — R2681 Unsteadiness on feet: Secondary | ICD-10-CM

## 2016-06-07 DIAGNOSIS — S0081XA Abrasion of other part of head, initial encounter: Secondary | ICD-10-CM | POA: Diagnosis present

## 2016-06-07 DIAGNOSIS — N289 Disorder of kidney and ureter, unspecified: Secondary | ICD-10-CM | POA: Diagnosis not present

## 2016-06-07 DIAGNOSIS — C7A1 Malignant poorly differentiated neuroendocrine tumors: Secondary | ICD-10-CM | POA: Diagnosis not present

## 2016-06-07 DIAGNOSIS — C7A8 Other malignant neuroendocrine tumors: Secondary | ICD-10-CM | POA: Diagnosis present

## 2016-06-07 DIAGNOSIS — R262 Difficulty in walking, not elsewhere classified: Secondary | ICD-10-CM

## 2016-06-07 DIAGNOSIS — E860 Dehydration: Secondary | ICD-10-CM | POA: Diagnosis present

## 2016-06-07 DIAGNOSIS — Z8546 Personal history of malignant neoplasm of prostate: Secondary | ICD-10-CM

## 2016-06-07 DIAGNOSIS — Z79891 Long term (current) use of opiate analgesic: Secondary | ICD-10-CM

## 2016-06-07 DIAGNOSIS — I69328 Other speech and language deficits following cerebral infarction: Secondary | ICD-10-CM | POA: Diagnosis not present

## 2016-06-07 DIAGNOSIS — S50312A Abrasion of left elbow, initial encounter: Secondary | ICD-10-CM | POA: Diagnosis present

## 2016-06-07 DIAGNOSIS — Z87891 Personal history of nicotine dependence: Secondary | ICD-10-CM | POA: Diagnosis not present

## 2016-06-07 DIAGNOSIS — Z8249 Family history of ischemic heart disease and other diseases of the circulatory system: Secondary | ICD-10-CM

## 2016-06-07 DIAGNOSIS — N3090 Cystitis, unspecified without hematuria: Secondary | ICD-10-CM | POA: Diagnosis present

## 2016-06-07 DIAGNOSIS — L8990 Pressure ulcer of unspecified site, unspecified stage: Secondary | ICD-10-CM | POA: Insufficient documentation

## 2016-06-07 LAB — COMPREHENSIVE METABOLIC PANEL
ALBUMIN: 3 g/dL — AB (ref 3.5–5.0)
ALT: 124 U/L — AB (ref 17–63)
AST: 104 U/L — AB (ref 15–41)
Alkaline Phosphatase: 339 U/L — ABNORMAL HIGH (ref 38–126)
Anion gap: 16 — ABNORMAL HIGH (ref 5–15)
BUN: 107 mg/dL — AB (ref 6–20)
CHLORIDE: 99 mmol/L — AB (ref 101–111)
CO2: 19 mmol/L — AB (ref 22–32)
CREATININE: 2.98 mg/dL — AB (ref 0.61–1.24)
Calcium: 11.2 mg/dL — ABNORMAL HIGH (ref 8.9–10.3)
GFR calc Af Amer: 22 mL/min — ABNORMAL LOW (ref >60)
GFR calc non Af Amer: 19 mL/min — ABNORMAL LOW (ref >60)
GLUCOSE: 224 mg/dL — AB (ref 65–99)
Potassium: 5.5 mmol/L — ABNORMAL HIGH (ref 3.5–5.1)
SODIUM: 134 mmol/L — AB (ref 135–145)
Total Bilirubin: 1.1 mg/dL (ref 0.3–1.2)
Total Protein: 7.7 g/dL (ref 6.5–8.1)

## 2016-06-07 LAB — URINALYSIS COMPLETE WITH MICROSCOPIC (ARMC ONLY)
Bilirubin Urine: NEGATIVE
Glucose, UA: NEGATIVE mg/dL
KETONES UR: NEGATIVE mg/dL
Leukocytes, UA: NEGATIVE
Nitrite: NEGATIVE
PH: 5 (ref 5.0–8.0)
PROTEIN: 100 mg/dL — AB
Specific Gravity, Urine: 1.015 (ref 1.005–1.030)

## 2016-06-07 LAB — CBC
HCT: 39.3 % — ABNORMAL LOW (ref 40.0–52.0)
Hemoglobin: 12.8 g/dL — ABNORMAL LOW (ref 13.0–18.0)
MCH: 26 pg (ref 26.0–34.0)
MCHC: 32.6 g/dL (ref 32.0–36.0)
MCV: 79.7 fL — AB (ref 80.0–100.0)
PLATELETS: 471 10*3/uL — AB (ref 150–440)
RBC: 4.93 MIL/uL (ref 4.40–5.90)
RDW: 15.9 % — AB (ref 11.5–14.5)
WBC: 16.5 10*3/uL — ABNORMAL HIGH (ref 3.8–10.6)

## 2016-06-07 LAB — TROPONIN I
Troponin I: 0.09 ng/mL (ref <0.03)
Troponin I: 0.03 ng/mL (ref <0.03)

## 2016-06-07 LAB — LACTIC ACID, PLASMA: Lactic Acid, Venous: 1.9 mmol/L (ref 0.5–1.9)

## 2016-06-07 LAB — TYPE AND SCREEN
ABO/RH(D): O NEG
Antibody Screen: NEGATIVE

## 2016-06-07 LAB — HEMOGLOBIN: Hemoglobin: 11.2 g/dL — ABNORMAL LOW (ref 13.0–18.0)

## 2016-06-07 MED ORDER — ONDANSETRON HCL 4 MG/2ML IJ SOLN
4.0000 mg | Freq: Four times a day (QID) | INTRAMUSCULAR | Status: DC | PRN
  Administered 2016-06-07: 4 mg via INTRAVENOUS
  Filled 2016-06-07: qty 2

## 2016-06-07 MED ORDER — SODIUM CHLORIDE 0.9 % IV SOLN
INTRAVENOUS | Status: AC
  Administered 2016-06-07 – 2016-06-08 (×2): via INTRAVENOUS

## 2016-06-07 MED ORDER — OXYCODONE HCL 5 MG PO TABS
10.0000 mg | ORAL_TABLET | Freq: Four times a day (QID) | ORAL | Status: DC | PRN
  Administered 2016-06-09 (×2): 10 mg via ORAL
  Filled 2016-06-07 (×3): qty 2

## 2016-06-07 MED ORDER — MEGESTROL ACETATE 40 MG PO TABS
40.0000 mg | ORAL_TABLET | Freq: Every day | ORAL | Status: DC
  Administered 2016-06-08 – 2016-06-10 (×3): 40 mg via ORAL
  Filled 2016-06-07 (×5): qty 1

## 2016-06-07 MED ORDER — CITALOPRAM HYDROBROMIDE 20 MG PO TABS
10.0000 mg | ORAL_TABLET | Freq: Every day | ORAL | Status: DC
  Administered 2016-06-08 – 2016-06-11 (×4): 10 mg via ORAL
  Filled 2016-06-07 (×5): qty 1

## 2016-06-07 MED ORDER — ONDANSETRON HCL 4 MG PO TABS: 4.0000 mg | ORAL_TABLET | Freq: Four times a day (QID) | ORAL | Status: DC | PRN

## 2016-06-07 MED ORDER — VANCOMYCIN HCL IN DEXTROSE 1-5 GM/200ML-% IV SOLN
1000.0000 mg | Freq: Once | INTRAVENOUS | Status: DC
  Administered 2016-06-07: 1000 mg via INTRAVENOUS
  Filled 2016-06-07: qty 200

## 2016-06-07 MED ORDER — SODIUM CHLORIDE 0.9% FLUSH
3.0000 mL | Freq: Two times a day (BID) | INTRAVENOUS | Status: DC
  Administered 2016-06-07 – 2016-06-11 (×5): 3 mL via INTRAVENOUS

## 2016-06-07 MED ORDER — SODIUM CHLORIDE 0.9 % IV BOLUS (SEPSIS)
1000.0000 mL | Freq: Once | INTRAVENOUS | Status: AC
  Administered 2016-06-07: 1000 mL via INTRAVENOUS

## 2016-06-07 MED ORDER — OXYCODONE HCL 5 MG PO TABS
5.0000 mg | ORAL_TABLET | ORAL | Status: DC | PRN
  Administered 2016-06-07 – 2016-06-10 (×5): 5 mg via ORAL
  Filled 2016-06-07 (×5): qty 1

## 2016-06-07 MED ORDER — BISACODYL 10 MG RE SUPP: 10.0000 mg | Freq: Every day | RECTAL | Status: DC | PRN

## 2016-06-07 MED ORDER — ASPIRIN 81 MG PO CHEW
324.0000 mg | CHEWABLE_TABLET | Freq: Once | ORAL | Status: AC
  Administered 2016-06-07: 324 mg via ORAL
  Filled 2016-06-07: qty 4

## 2016-06-07 MED ORDER — DOCUSATE SODIUM 100 MG PO CAPS
100.0000 mg | ORAL_CAPSULE | Freq: Two times a day (BID) | ORAL | Status: DC
  Administered 2016-06-07 – 2016-06-09 (×4): 100 mg via ORAL
  Filled 2016-06-07 (×4): qty 1

## 2016-06-07 MED ORDER — DEXTROSE 5 % IV SOLN
1.0000 g | INTRAVENOUS | Status: DC
  Administered 2016-06-07 – 2016-06-09 (×3): 1 g via INTRAVENOUS
  Filled 2016-06-07 (×4): qty 10

## 2016-06-07 MED ORDER — SENNA 8.6 MG PO TABS
1.0000 | ORAL_TABLET | Freq: Two times a day (BID) | ORAL | Status: DC
  Administered 2016-06-08 – 2016-06-09 (×3): 8.6 mg via ORAL
  Filled 2016-06-07 (×4): qty 1

## 2016-06-07 MED ORDER — FENTANYL 25 MCG/HR TD PT72
25.0000 ug | MEDICATED_PATCH | TRANSDERMAL | Status: DC
Start: 2016-06-07 — End: 2016-06-09
  Administered 2016-06-08: 25 ug via TRANSDERMAL
  Filled 2016-06-07: qty 1

## 2016-06-07 MED ORDER — POLYETHYLENE GLYCOL 3350 17 G PO PACK: 17.0000 g | PACK | Freq: Every day | ORAL | Status: DC | PRN

## 2016-06-07 MED ORDER — ACETAMINOPHEN 325 MG PO TABS
650.0000 mg | ORAL_TABLET | Freq: Four times a day (QID) | ORAL | Status: DC | PRN
  Administered 2016-06-08: 650 mg via ORAL
  Filled 2016-06-07: qty 2

## 2016-06-07 MED ORDER — PIPERACILLIN-TAZOBACTAM 3.375 G IVPB 30 MIN
3.3750 g | Freq: Once | INTRAVENOUS | Status: DC
  Administered 2016-06-07: 3.375 g via INTRAVENOUS
  Filled 2016-06-07: qty 50

## 2016-06-07 MED ORDER — DIPHENOXYLATE-ATROPINE 2.5-0.025 MG PO TABS: 1.0000 | ORAL_TABLET | Freq: Four times a day (QID) | ORAL | Status: DC | PRN

## 2016-06-07 MED ORDER — ALBUTEROL SULFATE (2.5 MG/3ML) 0.083% IN NEBU: 2.5000 mg | INHALATION_SOLUTION | RESPIRATORY_TRACT | Status: DC | PRN

## 2016-06-07 MED ORDER — ACETAMINOPHEN 650 MG RE SUPP: 650.0000 mg | Freq: Four times a day (QID) | RECTAL | Status: DC | PRN

## 2016-06-07 NOTE — ED Triage Notes (Signed)
Pt arrives via [facility] for possible gi bleed with weakness. Per report, pt fell last night around 1am and has been down on the ground until brought to the ED.  PT was found by his neighbor.  Presently, denies pain.

## 2016-06-07 NOTE — ED Notes (Signed)
Alinda Money (Daughter):  [phone]

## 2016-06-07 NOTE — ED Notes (Addendum)
Pt brought in by EMS.  Found on the floor of his house by a friend.  Pt reports that he was down since about 1 am last night. Pt has lung CA.  He reports increased weakness. Had one episode of vomiting. EMS reports that vomitus has coffee ground appearance.  Pt has dark stains around his mouth.  Pt c/o low back pain.  Has redness with blanching to sacral area

## 2016-06-07 NOTE — ED Notes (Signed)
Jonathon Benjamin;  [phone]

## 2016-06-07 NOTE — ED Notes (Signed)
Patient transported to CT 

## 2016-06-07 NOTE — Progress Notes (Signed)
Pt is not eating or drinking well does not have iv fluids ordered. Dr. Jannifer Franklin notified, MD to place order.

## 2016-06-07 NOTE — H&P (Addendum)
[facility] NAME: Jonathon Benjamin    MR#:  [id number]  DATE OF BIRTH:  September 23, 1940  DATE OF ADMISSION:  06/07/2016  PRIMARY CARE PHYSICIAN: Sherrin Daisy, MD   REQUESTING/REFERRING PHYSICIAN: Dr. Edd Fabian  CHIEF COMPLAINT:   Chief Complaint  Patient presents with  . GI Problem  . Weakness    HISTORY OF PRESENT ILLNESS:  Jonathon Benjamin  is a 75 y.o. 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 Patient had some dark coffee grounds in his vomiting. Very weak.  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  His lumbar x-ray today shows mild T11 compression fractures. No neurological deficits.  Patient is a poor historian and history obtained from family at bedside and reviewing old records.   PAST MEDICAL HISTORY:   Past Medical History:  Diagnosis Date  . Hypertension   . Prostate cancer ([place])   . Skin cancer   . Stomach cancer ([place])   . Stroke [facility])     PAST SURGICAL HISTORY:   Past Surgical History:  Procedure Laterality Date  . VASCULAR SURGERY      SOCIAL HISTORY:   Social History  Substance Use Topics  . Smoking status: Former Smoker    Quit date: 05/07/2008  . Smokeless tobacco: Not on file  . Alcohol use No    FAMILY HISTORY:   Family History  Problem Relation Age of Onset  . Cancer Maternal Aunt   . Heart attack Maternal Grandfather     DRUG ALLERGIES:  No Known Allergies  REVIEW OF SYSTEMS:   Review of Systems  Unable to perform ROS: Mental status change    MEDICATIONS AT HOME:   Prior to Admission medications   Medication Sig Start Date End Date Taking? Authorizing Provider  amLODipine (NORVASC) 5 MG tablet Take 5 mg by mouth daily.  04/17/16   Historical Provider, MD  aspirin EC 81 MG tablet Take 81 mg by mouth daily.      Historical Provider, MD  benazepril (LOTENSIN) 20 MG tablet Take 20 mg by mouth daily.  04/17/16   Historical Provider, MD  citalopram (CELEXA) 20 MG tablet Take by mouth. 04/17/16   Historical Provider, MD  diphenoxylate-atropine (LOMOTIL) 2.5-0.025 MG tablet Take 1 tablet by mouth 4 (four) times daily as needed for diarrhea or loose stools. 05/13/16   Lloyd Huger, MD  fentaNYL (DURAGESIC - DOSED MCG/HR) 25 MCG/HR patch Place 1 patch (25 mcg total) onto the skin every 3 (three) days. 05/30/16   Lloyd Huger, MD  Loratadine 10 MG CAPS Take 10 mg by mouth.  02/02/16   Historical Provider, MD  megestrol (MEGACE) 40 MG tablet Take 1 tablet (40 mg total) by mouth daily. 05/20/16   Lloyd Huger, MD  ondansetron (ZOFRAN) 8 MG tablet Take 1 tablet (8 mg total) by mouth every 8 (eight) hours as needed for nausea or vomiting. 05/09/16   Lloyd Huger, MD  Oxycodone HCl 10 MG TABS Take 1-2 tablets (10-20 mg total) by mouth every 6 (six) hours as needed. 05/30/16   Lloyd Huger, MD  promethazine (PHENERGAN) 25 MG tablet Take 1 tablet (25 mg total) by mouth every 6 (six) hours as needed for nausea or vomiting. 05/13/16   Lloyd Huger, MD  simvastatin (ZOCOR) 20 MG tablet Take  20 mg by mouth daily.  04/17/16   Historical Provider, MD     VITAL SIGNS:  Blood pressure 97/71, pulse 79, temperature 97.5 F (36.4 C), temperature source Oral, resp. rate 17, weight 60.6 kg (133 lb 8 oz), SpO2 99 %.  PHYSICAL EXAMINATION:  Physical Exam  GENERAL:  75 y.o.-year-old patient lying in the bed with no acute distress.  EYES: Pupils equal, round, reactive to light and accommodation. No scleral icterus. Extraocular muscles intact.  HEENT: Head atraumatic, normocephalic. Oropharynx and nasopharynx clear. No oropharyngeal erythema, dry oral mucosa  NECK:  Supple, no jugular venous distention. No thyroid enlargement, no tenderness.  LUNGS: Normal breath sounds bilaterally, no wheezing, rales,  rhonchi. No use of accessory muscles of respiration.  CARDIOVASCULAR: S1, S2 normal. No murmurs, rubs, or gallops.  ABDOMEN: Soft, nontender, nondistended. Bowel sounds present. No organomegaly or mass. Firm mass in epigastric and right upper quadrant area. EXTREMITIES: No pedal edema, cyanosis, or clubbing. + 2 pedal & radial pulses b/l.   NEUROLOGIC: Cranial nerves II through XII are intact. No focal Motor or sensory deficits appreciated b/l PSYCHIATRIC: The patient is alert and awake SKIN: No obvious rash, lesion, or ulcer.   LABORATORY PANEL:   CBC  Recent Labs Lab 06/07/16 1207  WBC 16.5*  HGB 12.8*  HCT 39.3*  PLT 471*   ------------------------------------------------------------------------------------------------------------------  Chemistries   Recent Labs Lab 06/07/16 1207  NA 134*  K 5.5*  CL 99*  CO2 19*  GLUCOSE 224*  BUN 107*  CREATININE 2.98*  CALCIUM 11.2*  AST 104*  ALT 124*  ALKPHOS 339*  BILITOT 1.1   ------------------------------------------------------------------------------------------------------------------  Cardiac Enzymes  Recent Labs Lab 06/07/16 1502  TROPONINI 0.09*   ------------------------------------------------------------------------------------------------------------------  RADIOLOGY:  Dg Lumbar Spine 2-3 Views  Result Date: 06/07/2016 CLINICAL DATA:  Fall. EXAM: LUMBAR SPINE - 2-3 VIEW COMPARISON:  PET-CT 05/30/2016 .  CT 04/15/2016 . FINDINGS: Degenerative changes lumbar spine. T11 mild compression fracture. This is new from prior studies P Minimal L1 anterior wedging, unchanged. No acute bony abnormality identified. Normal alignment mineralization. IMPRESSION: 1. T11 mild compression fracture. This is new from prior studies. Minimal anterior wedging L1, unchanged. 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 Diffuse degenerative change. 2. Aortoiliac atherosclerotic vascular  disease. Electronically Signed   By: Marcello Moores  Register   On: 06/07/2016 16:03   Ct Head Wo Contrast  Result Date: 06/07/2016 CLINICAL DATA:  Unwitnessed fall. EXAM: CT HEAD WITHOUT CONTRAST TECHNIQUE: Contiguous axial images were obtained from the base of the skull through the vertex without intravenous contrast. COMPARISON:  None. FINDINGS: Brain: Minimal diffuse cortical atrophy is noted. Minimal chronic ischemic white matter disease is noted. No mass effect or midline shift is noted. Ventricular size is within normal limits. There is no evidence of mass lesion, hemorrhage or acute infarction. Vascular: Atherosclerosis of internal carotid arteries is noted. Skull: Bony calvarium appears intact. Sinuses/Orbits: Right maxillary sinusitis is noted. Other: None. IMPRESSION: Right maxillary sinusitis. Minimal diffuse cortical atrophy. Minimal chronic ischemic white matter disease. No acute intracranial abnormality seen. Electronically Signed   By: Marijo Conception, M.D.   On: 06/07/2016 16:13   Dg Chest Port 1 View  Result Date: 06/07/2016 CLINICAL DATA:  Patient was found down in his home this morning by a friend. Patient states he thinks he fell around 1am this morning. Patient has coffee ground looking emesis today with increased weakness and low back pain since fall. Patient has a HX  of lung cancer. EXAM: PORTABLE CHEST 1 VIEW COMPARISON:  PET-CT, 05/30/2016 FINDINGS: Cardiac silhouette is normal in size and configuration. No mediastinal or hilar masses or evidence of adenopathy. Lungs are clear.  No pleural effusion or pneumothorax. Skeletal structures are demineralized but intact. IMPRESSION: No acute cardiopulmonary disease. Electronically Signed   By: Lajean Manes M.D.   On: 06/07/2016 15:59     IMPRESSION AND PLAN:   * Acute kidney injury due to dehydration. Patient has had decreased intake and recurrent vomiting Start IV fluids. Monitor input and output. Check repeat labs in the morning. Daily  weight. Avoid nephrotoxic medications.  * Vomiting This is most likely due to his neuroendocrine tumor with liver metastases. Check abdominal x-ray to rule out any obstruction. No vomiting at this time. Auto Zofran when necessary. If he continues to have vomiting consider CT scan of the abdomen. There is some concern as to coffee grounds in his vomiting. Check stool for Hemoccult. Hemoglobin is stable.  * Elevated troponin This is likely demand ischemia with acute renal failure. We'll check a CK level. No aspirin or heparin due to concern for GI bleed.  * Hypertension Hold medications due to low normal blood pressure.  * Mild T11 compression fracture without neurological deficits. Pain medications as needed.  * DVT prophylaxis with SCDs  All the records are reviewed and case discussed with ED provider. Management plans discussed with the patient, family and they are in agreement.  CODE STATUS: FULL CODE  TOTAL TIME TAKING CARE OF THIS PATIENT: 40 minutes.   Hillary Bow R M.D on 06/07/2016 at 4:56 PM  Between 7am to 6pm - Pager - [phone]  After 6pm go to [email or web address] - password [email or web address] [place] [facility]  Office  [phone]  CC: Primary care physician; Sherrin Daisy, MD  Note: This dictation was prepared with Dragon dictation along with smaller phrase technology. Any transcriptional errors that result from this process are unintentional.

## 2016-06-07 NOTE — ED Provider Notes (Addendum)
[facility] Emergency Department Provider Note ____________________________________________   I have reviewed the triage vital signs and the triage nursing note.  HISTORY  Chief Complaint GI Problem and Weakness   Historian Level V caveat, patient extremely poor historian Family friend who is been providing some care while the daughter who is the healthcare power of attorney is out of town and is here giving most of the history A supportive ex-wife is also here providing support in history  HPI Jonathon Benjamin is a 75 y.o. male who lives alone, is being treated for liver cancer, was feeling weak yesterday when he went to his cancer appointment, and apparently fell overnight. He states that he got up to go the bathroom and then fell. A friend who is caring for him checked on him this morning and found him on the floor since maybe 2 AM last night. He states that he has pain in his back. He apparently struck his face and has an abrasion. Little unclear if he is altered mental status or Baseline, but he is a very poor historian.  Denies coughing or chest pain. Does not these had a fever. Denies abdominal pain or dysuria.  History of prior stroke which apparently gave him slurred speech.    Past Medical History:  Diagnosis Date  . Hypertension   . Prostate cancer ([place])   . Skin cancer   . Stomach cancer ([place])   . Stroke [facility])     Patient Active Problem List   Diagnosis Date Noted  . 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 Liver mass 04/22/2016    Past Surgical History:  Procedure Laterality Date  . VASCULAR SURGERY      Prior to Admission medications   Medication Sig Start Date End Date Taking? Authorizing Provider  amLODipine (NORVASC) 5 MG tablet Take 5 mg by mouth daily.  04/17/16   Historical Provider, MD  aspirin EC 81 MG tablet Take 81 mg by mouth daily.     Historical Provider, MD  benazepril (LOTENSIN) 20  MG tablet Take 20 mg by mouth daily.  04/17/16   Historical Provider, MD  citalopram (CELEXA) 20 MG tablet Take by mouth. 04/17/16   Historical Provider, MD  diphenoxylate-atropine (LOMOTIL) 2.5-0.025 MG tablet Take 1 tablet by mouth 4 (four) times daily as needed for diarrhea or loose stools. 05/13/16   Lloyd Huger, MD  fentaNYL (DURAGESIC - DOSED MCG/HR) 25 MCG/HR patch Place 1 patch (25 mcg total) onto the skin every 3 (three) days. 05/30/16   Lloyd Huger, MD  Loratadine 10 MG CAPS Take 10 mg by mouth.  02/02/16   Historical Provider, MD  megestrol (MEGACE) 40 MG tablet Take 1 tablet (40 mg total) by mouth daily. 05/20/16   Lloyd Huger, MD  ondansetron (ZOFRAN) 8 MG tablet Take 1 tablet (8 mg total) by mouth every 8 (eight) hours as needed for nausea or vomiting. 05/09/16   Lloyd Huger, MD  Oxycodone HCl 10 MG TABS Take 1-2 tablets (10-20 mg total) by mouth every 6 (six) hours as needed. 05/30/16   Lloyd Huger, MD  promethazine (PHENERGAN) 25 MG tablet Take 1 tablet (25 mg total) by mouth every 6 (six) hours as needed for nausea or vomiting. 05/13/16   Lloyd Huger, MD  simvastatin (ZOCOR) 20 MG tablet Take 20 mg by mouth daily.  04/17/16   Historical Provider, MD    No Known Allergies  Family History  Problem  Relation Age of Onset  . Cancer Maternal Aunt   . Heart attack Maternal Grandfather     Social History Social History  Substance Use Topics  . Smoking status: Former Smoker    Quit date: 05/07/2008  . Smokeless tobacco: Not on file  . Alcohol use No    Review of Systems  Limited due to altered mental status/poor historian, but denies chest pain, trouble breathing, fever, abdominal pain, dysuria, and does have the back pain. ____________________________________________   PHYSICAL EXAM:  VITAL SIGNS: ED Triage Vitals  Enc Vitals Group     BP 06/07/16 1159 97/71     Pulse --      Resp 06/07/16 1159 (!) 23     Temp 06/07/16 1159 97.5 F (36.4  C)     Temp Source 06/07/16 1159 Oral     SpO2 06/07/16 1159 98 %     Weight 06/07/16 1201 133 lb 8 oz (60.6 kg)     Height --      Head Circumference --      Peak Flow --      Pain Score --      Pain Loc --      Pain Edu? --      Excl. in [place]? --      Constitutional: Alert and Cooperative, but poor historian. He looks thin and cachectic and disheveled, but no acute distress. HEENT   Head: Normocephalic, he does have an abrasion to his right forehead.      Eyes: Conjunctivae are slightly yellow. PERRL. Normal extraocular movements.      Ears:         Nose: No congestion/rhinnorhea.   Mouth/Throat: Mucous membranes are mildly dry.   Neck: No stridor. No posterior midline C-spine tenderness to palpation or with range of motion. Cardiovascular/Chest: Normal rate, regular rhythm.  No murmurs, rubs, or gallops. Respiratory: Normal respiratory effort without tachypnea nor retractions. Breath sounds are clear and equal bilaterally. No wheezes/rales/rhonchi. Gastrointestinal: Soft. No distention, no guarding, no rebound. Nontender.  Genitourinary/rectal:Deferred Musculoskeletal: Nontender with normal range of motion in all extremities. No joint effusions.  No lower extremity tenderness.  No edema. Neurologic:  Initially he seemed to have some slurred speech, but his mouth is very dry and he has no teeth, when I asked him to enunciate he was able to do so. He seems weak all over, without focal extremity weakness. No sensory changes I can elicit. Skin:  Skin is warm, dry and intact. No rash noted. Psychiatric: No hallucinations.   ____________________________________________  LABS (pertinent positives/negatives)  Labs Reviewed  COMPREHENSIVE METABOLIC PANEL - Abnormal; Notable for the following:       Result Value   Sodium 134 (*)    Potassium 5.5 (*)    Chloride 99 (*)    CO2 19 (*)    Glucose, Bld 224 (*)    BUN 107 (*)    Creatinine, Ser 2.98 (*)    Calcium 11.2 (*)     Albumin 3.0 (*)    AST 104 (*)    ALT 124 (*)    Alkaline Phosphatase 339 (*)    GFR calc non Af Amer 19 (*)    GFR calc Af Amer 22 (*)    Anion gap 16 (*)    All other components within normal limits  CBC - Abnormal; Notable for the following:    WBC 16.5 (*)    Hemoglobin 12.8 (*)    HCT 39.3 (*)  MCV 79.7 (*)    RDW 15.9 (*)    Platelets 471 (*)    All other components within normal limits  TROPONIN I - Abnormal; Notable for the following:    Troponin I 0.09 (*)    All other components within normal limits  URINE CULTURE  CULTURE, BLOOD (ROUTINE X 2)  CULTURE, BLOOD (ROUTINE X 2)  LACTIC ACID, PLASMA  URINALYSIS COMPLETEWITH MICROSCOPIC ([facility] ONLY)  POC OCCULT BLOOD, ED  TYPE AND SCREEN  Urinalysis is pending  ____________________________________________    EKG I, Lisa Roca, MD, the attending physician have personally viewed and interpreted all ECGs.  Pending ____________________________________________  RADIOLOGY All Xrays were viewed by me. Imaging interpreted by Radiologist.  CT head noncontrast: IMPRESSION: Right maxillary sinusitis. Minimal diffuse cortical atrophy. Minimal chronic ischemic white matter disease. No acute intracranial abnormality seen.  Chest x-ray portable: IMPRESSION: 1. T11 mild compression fracture. This is new from prior studies. Minimal anterior wedging L1, unchanged. 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 Diffuse degenerative change.  2. Aortoiliac atherosclerotic vascular disease.  Chest portable:  IMPRESSION: No acute cardiopulmonary disease. __________________________________________  PROCEDURES  Procedure(s) performed: None  Critical Care performed: CRITICAL CARE Performed by: Lisa Roca   Total critical care time: 30 minutes  Critical care time was exclusive of separately billable procedures and treating other patients.  Critical care was necessary to  treat or prevent imminent or life-threatening deterioration.  Critical care was time spent personally by me on the following activities: development of treatment plan with patient and/or surrogate as well as nursing, discussions with consultants, evaluation of patient's response to treatment, examination of patient, obtaining history from patient or surrogate, ordering and performing treatments and interventions, ordering and review of laboratory studies, ordering and review of radiographic studies, pulse oximetry and re-evaluation of patient's condition.   ____________________________________________   ED COURSE / ASSESSMENT AND PLAN  Pertinent labs & imaging results that were available during my care of the patient were reviewed by me and considered in my medical decision making (see chart for details).    Mr. Colbeck was brought in by a family friend due to being found on the floor last night. He had an unwitnessed fall, and from a traumatic perspective, he does have an abrasion to his head and I will order a head CT given that he does seem a little altered. Although he seems to be a poor historian, he does seem to be able to give accurate description of where he is hurting, and he is only complaining about his low back. He is having no cervical spine tenderness, and I didn't order any additional cervical imaging.  Additionally, he looks dehydrated and has a somewhat low blood pressure. He is started on IV fluids. His laboratory studies show not only an elevated white blood cell count, but also acute kidney injury. Although this may be due to simple dehydration, especially given his cancer patient, I am going to cover him for the possibility of sepsis with vancomycin and Zosyn.  Lumbar spine shows compression fracture. Head CT reassuring. Chest x-ray reassuring. Troponin minimally elevated 0.09. Patient given aspirin.  I discussed with the hospitalist for admission, only ua  pending.    CONSULTATIONS: Hospitalist for admission.  Patient / Family / Caregiver informed of clinical course, medical decision-making process, and agree with plan.   ___________________________________________   FINAL CLINICAL IMPRESSION(S) / ED DIAGNOSES   Final diagnoses:  Dehydration  AKI (acute kidney injury) (  [place])  Sepsis, due to unspecified organism ([place])  Abrasion, face without infection  Abrasion of left elbow, initial encounter  Closed wedge compression fracture of eleventh thoracic vertebra, initial encounter [facility])              Note: This dictation was prepared with Dragon dictation. Any transcriptional errors that result from this process are unintentional    Lisa Roca, MD 06/07/16 [place], MD 06/07/16 [phone]

## 2016-06-08 ENCOUNTER — Encounter: Payer: Self-pay | Admitting: *Deleted

## 2016-06-08 DIAGNOSIS — L8990 Pressure ulcer of unspecified site, unspecified stage: Secondary | ICD-10-CM | POA: Insufficient documentation

## 2016-06-08 LAB — COMPREHENSIVE METABOLIC PANEL
ALK PHOS: 250 U/L — AB (ref 38–126)
ALT: 78 U/L — AB (ref 17–63)
ANION GAP: 10 (ref 5–15)
AST: 63 U/L — ABNORMAL HIGH (ref 15–41)
Albumin: 2.3 g/dL — ABNORMAL LOW (ref 3.5–5.0)
BILIRUBIN TOTAL: 0.8 mg/dL (ref 0.3–1.2)
BUN: 102 mg/dL — ABNORMAL HIGH (ref 6–20)
CALCIUM: 9.8 mg/dL (ref 8.9–10.3)
CO2: 19 mmol/L — AB (ref 22–32)
CREATININE: 2.49 mg/dL — AB (ref 0.61–1.24)
Chloride: 109 mmol/L (ref 101–111)
GFR calc non Af Amer: 24 mL/min — ABNORMAL LOW (ref >60)
GFR, EST AFRICAN AMERICAN: 28 mL/min — AB (ref >60)
GLUCOSE: 156 mg/dL — AB (ref 65–99)
Potassium: 4.8 mmol/L (ref 3.5–5.1)
SODIUM: 138 mmol/L (ref 135–145)
TOTAL PROTEIN: 6 g/dL — AB (ref 6.5–8.1)

## 2016-06-08 LAB — CBC
HCT: 32.5 % — ABNORMAL LOW (ref 40.0–52.0)
HEMOGLOBIN: 10.8 g/dL — AB (ref 13.0–18.0)
MCH: 26.2 pg (ref 26.0–34.0)
MCHC: 33.2 g/dL (ref 32.0–36.0)
MCV: 79.1 fL — ABNORMAL LOW (ref 80.0–100.0)
PLATELETS: 411 10*3/uL (ref 150–440)
RBC: 4.11 MIL/uL — AB (ref 4.40–5.90)
RDW: 16.2 % — ABNORMAL HIGH (ref 11.5–14.5)
WBC: 13 10*3/uL — ABNORMAL HIGH (ref 3.8–10.6)

## 2016-06-08 LAB — ECHOCARDIOGRAM COMPLETE
HEIGHTINCHES: 65 in
Weight: 2049.6 oz

## 2016-06-08 LAB — TROPONIN I: Troponin I: 0.17 ng/mL (ref <0.03)

## 2016-06-08 MED ORDER — SODIUM CHLORIDE 0.9 % IV SOLN
INTRAVENOUS | Status: AC
Start: 2016-06-08 — End: 2016-06-08
  Administered 2016-06-08: 19:00:00 via INTRAVENOUS

## 2016-06-08 NOTE — Progress Notes (Signed)
Spoke with DR. Diamond pt with Troponin of 0.17. MD acknowledged no new orders at this time.

## 2016-06-08 NOTE — Evaluation (Signed)
Physical Therapy Evaluation Patient Details Name: Jonathon Benjamin MRN: [id number] DOB: [birth year] Today's Date: 06/08/2016   History of Present Illness  Pt admitted for dehydration. Pt with complaints of weakness and was found on floor at home. Pt with slightly elevated troponin, however cleared by MD. 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 Pt now with acute T11 mild compression fracture. Pt is poor historian, unsure of accurate history.  Clinical Impression  Pt is a pleasant 75 year old male who was admitted for dehydration. Pt performs bed mobility with min assist, transfers with mod assist, and ambulation with min assist and RW. Pt very poor historian, only answers simple yes/no questions. Pt demonstrates deficits with strength/balance/endurance. Pt is currently unsafe to live alone and is very high falls risk with B knee buckling noted during OOB attempts. Complains of back pain while transitioning to chair, difficult to become comfortable in recliner. Would benefit from skilled PT to address above deficits and promote optimal return to PLOF; recommend transition to STR upon discharge from acute hospitalization.      Follow Up Recommendations SNF    Equipment Recommendations       Recommendations for Other Services       Precautions / Restrictions Precautions Precautions: Fall Restrictions Weight Bearing Restrictions: No      Mobility  Bed Mobility Overal bed mobility: Needs Assistance Bed Mobility: Supine to Sit     Supine to sit: Min assist     General bed mobility comments: assist for sliding B LE off EOB. Once seated at EOB, pt with poor trunk balance, with multiple LOB towards R side. Constant min assist required for upright posture.  Transfers Overall transfer level: Needs assistance Equipment used: Rolling walker (2 wheeled) Transfers: Sit to/from Stand Sit to Stand: Mod assist         General transfer comment: transfers  performed with RW and mod assist. Once standing, pt buckles and has uncontrolled descent back to bed. On 2nd attempt, pt able to stand with improved B knee extension.  Ambulation/Gait Ambulation/Gait assistance: Min assist Ambulation Distance (Feet): 3 Feet Assistive device: Rolling walker (2 wheeled) Gait Pattern/deviations: Step-to pattern     General Gait Details: ambulated with slow step to gait pattern. Pt with small LOB, requiring min assist for correction. Pt fatigues with minimal distance, unable to further ambulate at this time  Stairs            Wheelchair Mobility    Modified Rankin (Stroke Patients Only)       Balance Overall balance assessment: Needs assistance;History of Falls Sitting-balance support: Feet supported;Bilateral upper extremity supported Sitting balance-Leahy Scale: Poor     Standing balance support: Bilateral upper extremity supported Standing balance-Leahy Scale: Poor                               Pertinent Vitals/Pain Pain Assessment: Faces Faces Pain Scale: Hurts a little bit Pain Location: low back Pain Descriptors / Indicators: Constant;Discomfort;Grimacing Pain Intervention(s): Limited activity within patient's tolerance;Repositioned    Home Living Family/patient expects to be discharged to:: Private residence Living Arrangements: Alone   Type of Home: House Home Access: Stairs to enter Entrance Stairs-Rails: None Entrance Stairs-Number of Steps: 3 Home Layout: One level Home Equipment: Walker - 2 wheels Additional Comments: pt is poor historian, no family in room    Prior Function  Comments: Pt unable to describe PLOF in detail, however reports he was able to walk from his bed to chair     Hand Dominance        Extremity/Trunk Assessment   Upper Extremity Assessment: Generalized weakness (B UE grossly 3/5)           Lower Extremity Assessment: Generalized weakness (B LE grossly 3/5)          Communication   Communication: No difficulties  Cognition Arousal/Alertness: Awake/alert Behavior During Therapy: Flat affect;Restless Overall Cognitive Status: Difficult to assess                      General Comments      Exercises     Assessment/Plan    PT Assessment Patient needs continued PT services  PT Problem List Decreased strength;Decreased activity tolerance;Decreased balance;Decreased mobility;Pain          PT Treatment Interventions Gait training;DME instruction;Therapeutic exercise;Balance training    PT Goals (Current goals can be found in the Care Plan section)  Acute Rehab PT Goals Patient Stated Goal: to get stronger PT Goal Formulation: With patient Time For Goal Achievement: 06/22/16 Potential to Achieve Goals: Good    Frequency Min 2X/week   Barriers to discharge Inaccessible home environment;Decreased caregiver support      Co-evaluation               End of Session Equipment Utilized During Treatment: Gait belt Activity Tolerance: Patient limited by fatigue Patient left: in chair;with chair alarm set Nurse Communication: Mobility status         Time: [id number] PT Time Calculation (min) (ACUTE ONLY): 28 min   Charges:   PT Evaluation $PT Eval Moderate Complexity: 1 Procedure     PT G Codes:        Nadya Hopwood Jun 25, 2016, 12:47 PM  Greggory Stallion, PT, DPT [phone]

## 2016-06-08 NOTE — Progress Notes (Signed)
[facility] [place] NAME: Jonathon Benjamin    MR#:  [id number]  DATE OF BIRTH:  02/27/1941  SUBJECTIVE: Admitted for fall, acute renal failure, denies weakness, nausea, vomiting. Patient still has a lot of for nausea and not able to eat, also complains of back pain, does not want to get up.   CHIEF COMPLAINT:   Chief Complaint  Patient presents with  . GI Problem  . Weakness    REVIEW OF SYSTEMS:   Review of Systems  Constitutional: Positive for malaise/fatigue and weight loss. Negative for chills and fever.  HENT: Negative for hearing loss.   Eyes: Negative for blurred vision, double vision and photophobia.  Respiratory: Negative for cough, hemoptysis and shortness of breath.   Cardiovascular: Negative for palpitations, orthopnea and leg swelling.  Gastrointestinal: Positive for abdominal pain, heartburn and nausea. Negative for diarrhea and vomiting.  Genitourinary: Negative for dysuria and urgency.  Musculoskeletal: Positive for back pain. Negative for myalgias and neck pain.  Skin: Negative for rash.  Neurological: Positive for weakness. Negative for dizziness, focal weakness, seizures and headaches.  Psychiatric/Behavioral: Negative for memory loss. The patient does not have insomnia.    C DRUG ALLERGIES:  No Known Allergies  VITALS:  Blood pressure 137/61, pulse 70, temperature 98.3 F (36.8 C), temperature source Oral, resp. rate 18, height 5\' 5"  (1.651 m), weight 57.8 kg (127 lb 8 oz), SpO2 94 %.  PHYSICAL EXAMINATION:  GENERAL:  75 y.o.-year-old patient lying in the bed with no acute distress. Hoarseness of voice    EYES: Pupils equal, round, reactive to light and accommodation. No scleral icterus. Extraocular muscles intact.  HEENT: Head atraumatic, normocephalic. Oropharynx and nasopharynx clear.  NECK:  Supple, no jugular venous distention. No thyroid enlargement, no tenderness.  LUNGS: Normal breath sounds  bilaterally, no wheezing, rales,rhonchi or crepitation. No use of accessory muscles of respiration.  CARDIOVASCULAR: S1, S2 normal. No murmurs, rubs, or gallops.  ABDOMEN:chronic  generalized abdominal tenderness present.BSpresent.no organomegaly,  No rebound tenderness present bowel sounds are present  EXTREMITIES: No pedal edema, cyanosis, or clubbing.  NEUROLOGIC: Cranial nerves II through XII are intact. Muscle strength 5/5 in all extremities. Sensation intact. Gait not checked.  PSYCHIATRIC: The patient is alert and oriented x 3.  SKIN: No obvious rash, lesion, or ulcer.    LABORATORY PANEL:   CBC  Recent Labs Lab 06/08/16 0251  WBC 13.0*  HGB 10.8*  HCT 32.5*  PLT 411   ------------------------------------------------------------------------------------------------------------------  Chemistries   Recent Labs Lab 06/08/16 0251  NA 138  K 4.8  CL 109  CO2 19*  GLUCOSE 156*  BUN 102*  CREATININE 2.49*  CALCIUM 9.8  AST 63*  ALT 78*  ALKPHOS 250*  BILITOT 0.8   ------------------------------------------------------------------------------------------------------------------  Cardiac Enzymes  Recent Labs Lab 06/08/16 0251  TROPONINI 0.17*   ------------------------------------------------------------------------------------------------------------------  RADIOLOGY:  Dg Lumbar Spine 2-3 Views  Result Date: 06/07/2016 CLINICAL DATA:  Fall. EXAM: LUMBAR SPINE - 2-3 VIEW COMPARISON:  PET-CT 05/30/2016 .  CT 04/15/2016 . FINDINGS: Degenerative changes lumbar spine. T11 mild compression fracture. This is new from prior studies P Minimal L1 anterior wedging, unchanged. No acute bony abnormality identified. Normal alignment mineralization. IMPRESSION: 1. T11 mild compression fracture. This is new from prior studies. Minimal anterior wedging L1, unchanged. 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 Diffuse degenerative  change. 2. Aortoiliac atherosclerotic vascular disease. Electronically Signed   By: Marcello Moores  Register   On: 06/07/2016 16:03   Ct Head Wo Contrast  Result Date: 06/07/2016 CLINICAL DATA:  Unwitnessed fall. EXAM: CT HEAD WITHOUT CONTRAST TECHNIQUE: Contiguous axial images were obtained from the base of the skull through the vertex without intravenous contrast. COMPARISON:  None. FINDINGS: Brain: Minimal diffuse cortical atrophy is noted. Minimal chronic ischemic white matter disease is noted. No mass effect or midline shift is noted. Ventricular size is within normal limits. There is no evidence of mass lesion, hemorrhage or acute infarction. Vascular: Atherosclerosis of internal carotid arteries is noted. Skull: Bony calvarium appears intact. Sinuses/Orbits: Right maxillary sinusitis is noted. Other: None. IMPRESSION: Right maxillary sinusitis. Minimal diffuse cortical atrophy. Minimal chronic ischemic white matter disease. No acute intracranial abnormality seen. Electronically Signed   By: Marijo Conception, M.D.   On: 06/07/2016 16:13   Dg Chest Port 1 View  Result Date: 06/07/2016 CLINICAL DATA:  Patient was found down in his home this morning by a friend. Patient states he thinks he fell around 1am this morning. Patient has coffee ground looking emesis today with increased weakness and low back pain since fall. Patient has a HX of lung cancer. EXAM: PORTABLE CHEST 1 VIEW COMPARISON:  PET-CT, 05/30/2016 FINDINGS: Cardiac silhouette is normal in size and configuration. No mediastinal or hilar masses or evidence of adenopathy. Lungs are clear.  No pleural effusion or pneumothorax. Skeletal structures are demineralized but intact. IMPRESSION: No acute cardiopulmonary disease. Electronically Signed   By: Lajean Manes M.D.   On: 06/07/2016 15:59   Dg Abd 2 Views  Result Date: 06/07/2016 CLINICAL DATA:  Vomiting.  History of prostate and stomach cancer. EXAM: ABDOMEN - 2 VIEW COMPARISON:  CT of the abdomen  04/15/2016 FINDINGS: The bowel gas pattern is normal. There is no evidence of free air. The liver shadow is enlarged reaching the right iliac crest on supine images. There is an 11 mm and 5 mm calcific densities overlying the left renal shadow IMPRESSION: Abnormal appearance of the liver, which may be due to enlarging right lobe of the liver mass. Calcific densities overlying the left renal shadow, which likely represent renal calculi. Electronically Signed   By: Fidela Salisbury M.D.   On: 06/07/2016 17:28    EKG:   Orders placed or performed in visit on 04/15/11  . 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 Patient has chronic abdominal pain, diarrhea, nausea.: Patient admitted because of fall, found to have acute renal failure, UTI, T11 compression fracture. #1 acute renal failure with ATN secondary to poor by mouth intake: Continue IV hydration, will involve palliative care to discuss the goals of treatment.The records from Dr. Grayland Ormond, patient to performance status is declining continues to poor appetite, increased nausea, diarrhea. #2. Slightly elevated troponins likely secondary to fall, renal failure. EF 65% . #3 chronic pain issues; she patient is on fentanyl patch, oxycodone. Continue them. Physical therapy consult. Patient may need to go to skilled nursing.  Poor appetite: On Megace. Continue that. Registered dietitian consult.  Cystitis: UA abnormal. Continue Rocephin. Follow urine cultures.   All the records are reviewed and case discussed with Care Management/Social Workerr. Management plans discussed with the patient, family and they are in agreement   CODE STATUS: stable  TOTAL TIME TAKING CARE OF THIS PATIENT: 35  minutes.   POSSIBLE D/C IN 1-2  DAYS, DEPENDING ON CLINICAL CONDITION.   Epifanio Lesches  M.D on 06/08/2016 at 12:09 PM  Between 7am to 6pm - Pager  - [phone]  After 6pm go to [email or web address] - password [email or web address] [facility]  Office  [phone]  CC: Primary care physician; Sherrin Daisy, MD   Note: This dictation was prepared with Dragon dictation along with smaller phrase technology. Any transcriptional errors that result from this process are unintentional.

## 2016-06-08 NOTE — Plan of Care (Signed)
Problem: Fluid Volume: Goal: Ability to maintain a balanced intake and output will improve Outcome: Not Progressing Patient not eating much at all, pt states just not hungry

## 2016-06-08 NOTE — Progress Notes (Signed)
Pt is alert. No emesis or stool during the night. Medicated for pain x1 with good results. Pt able to sleep in between care. Turned Q2H. Iv infusing without difficulty. Pt with poor po intake.

## 2016-06-09 ENCOUNTER — Other Ambulatory Visit: Payer: Self-pay | Admitting: Hematology and Oncology

## 2016-06-09 LAB — BASIC METABOLIC PANEL
ANION GAP: 9 (ref 5–15)
BUN: 73 mg/dL — ABNORMAL HIGH (ref 6–20)
CHLORIDE: 109 mmol/L (ref 101–111)
CO2: 19 mmol/L — ABNORMAL LOW (ref 22–32)
Calcium: 9.8 mg/dL (ref 8.9–10.3)
Creatinine, Ser: 1.56 mg/dL — ABNORMAL HIGH (ref 0.61–1.24)
GFR, EST AFRICAN AMERICAN: 49 mL/min — AB (ref >60)
GFR, EST NON AFRICAN AMERICAN: 42 mL/min — AB (ref >60)
Glucose, Bld: 133 mg/dL — ABNORMAL HIGH (ref 65–99)
POTASSIUM: 4.2 mmol/L (ref 3.5–5.1)
SODIUM: 137 mmol/L (ref 135–145)

## 2016-06-09 LAB — C DIFFICILE QUICK SCREEN W PCR REFLEX
C DIFFICILE (CDIFF) INTERP: NOT DETECTED
C DIFFICILE (CDIFF) TOXIN: NEGATIVE
C Diff antigen: NEGATIVE

## 2016-06-09 LAB — CBC
HEMATOCRIT: 33.1 % — AB (ref 40.0–52.0)
Hemoglobin: 10.9 g/dL — ABNORMAL LOW (ref 13.0–18.0)
MCH: 26 pg (ref 26.0–34.0)
MCHC: 33 g/dL (ref 32.0–36.0)
MCV: 78.8 fL — AB (ref 80.0–100.0)
Platelets: 400 10*3/uL (ref 150–440)
RBC: 4.2 MIL/uL — AB (ref 4.40–5.90)
RDW: 16.3 % — AB (ref 11.5–14.5)
WBC: 12.1 10*3/uL — AB (ref 3.8–10.6)

## 2016-06-09 LAB — URINE CULTURE

## 2016-06-09 MED ORDER — ENSURE ENLIVE PO LIQD
237.0000 mL | Freq: Two times a day (BID) | ORAL | Status: DC
  Administered 2016-06-09 – 2016-06-11 (×5): 237 mL via ORAL

## 2016-06-09 MED ORDER — FENTANYL 50 MCG/HR TD PT72
50.0000 ug | MEDICATED_PATCH | TRANSDERMAL | Status: DC
  Administered 2016-06-09: 50 ug via TRANSDERMAL
  Filled 2016-06-09: qty 1

## 2016-06-09 MED ORDER — OXYCODONE HCL 5 MG PO TABS
10.0000 mg | ORAL_TABLET | Freq: Once | ORAL | Status: AC
  Administered 2016-06-09: 12:00:00 10 mg via ORAL
  Filled 2016-06-09: qty 2

## 2016-06-09 NOTE — Progress Notes (Signed)
Per Dr. Vianne Bulls, patient to transfer to Oncology floor with telemetry when bed available.

## 2016-06-09 NOTE — Plan of Care (Signed)
Problem: Education: Goal: Knowledge of [facility] Education information/materials will improve Outcome: Not Progressing Pt transferred today from 2A. Alert to person and forgetful.  Problem: Pain Managment: Goal: General experience of comfort will improve Outcome: Progressing Upon transfer pt c/o pain in the waist area-bilateral hips. MD notified, fentanyl patch dose increased to 7mcg and oxycodone ordered and given with improvement.  Problem: Skin Integrity: Goal: Risk for impaired skin integrity will decrease Outcome: Not Progressing Upon transfer noted during skin assessment pt has pressure ulcer stage I on sacrum, Deep tissue injury on L and R hip. Foam dressing applied, heels elevated on a pillow, repositioned pt qx2hrs.  Problem: Fluid Volume: Goal: Ability to maintain a balanced intake and output will improve Outcome: Not Progressing Poor appetite. Pt declined lunch. Ensure given. Pt encouraged to eat and drink.  Problem: Bowel/Gastric: Goal: Will not experience complications related to bowel motility Outcome: Progressing 1xloose stool upon transfer. Noted pt had 3xloose stool in the past 24hrs. MD notified. Enteric precautions initiated. Awaiting stool sample to check for c diff.

## 2016-06-09 NOTE — Consult Note (Signed)
[facility]  Date of admission:  06/07/2016  Inpatient day:  06/09/2016  Consulting physician:  Dr. Epifanio Lesches  Reason for Consultation:  Back pain, neuroendocrine tumor, failure to thrive supposed to get a PET scan.  Chief Complaint: Jonathon Benjamin is a 75 y.o. male with high grade neuroendocrine tumor who was admitted with acute renal insufficiency, elevated troponins, and a T11 compression fracture.  HPI: The patient was diagnosed with liver metastasis when undergoing ultrasound evaluation of his aorta by vascular surgery. Abdomen and pelvic CT scan on 04/15/2016 revealed a 14 cm mass invading the gallbladder as well as a 7 cm right liver mass, and bilateral adrenal metastasis.  CT-guided liver biopsy on 05/07/2016 revealed a high-grade carcinoma with neuroendocrine differentiation.  CD X2 positivity was consistent with a GI tract origin. Pancreaticobiliary primary was not excluded. IHC ruled out melanoma, GIST, hepatocellular carcinoma, prostate and urothelial carcinoma.  PET scan on 05/30/2016 was unable to be completed in its entirety secondary to severe pain. Images revealed mild progression of liver metastasis, mild increase in bilateral adrenal masses and new mild abdominal retroperitoneal adenopathy. There were several subcentimeter mediastinal lymph nodes paratracheal region. There were multiple hypermetabolic bone metastasis involving the sacrum, pelvis, and bilateral proximal femurs.  The patient was seen by Dr. Grayland Ormond on 05/30/2016 for increasing pain.  Performance status was noted to be declining.  He had a poor appetite and increased nausea.  Diarrhea was improved on octreotide.  He denied any neurologic complaints. He had no melena or hematochezia.  He had hypercalcemia likely secondary to his underlying malignancy.     He is scheduled to begin carboplatin and etoposide.  The patient presented to the emergency room on 06/07/2016 with general  weakness.  He had a fall overnight and was found on the floor by a friend.  He complained of pain in his back.  He states that he is unable to care for himself.  Head CT without contrast on 06/07/2016 revealed right maxillary sinusitis. There was minimal diffuse cortical atrophy. There were minimal chronic ischemic white matter disease. There is no acute intracranial abnormality.  Lumbar spine films revealed a T11 mild compression fracture which was new from prior studies. There was minimal anterior wedging of L1, unchanged.  Chest x-ray revealed no acute cardiopulmonary disease.  Labs were notable for a hematocrit of 39.3, hemoglobin 12.8, MCV 79.7, platelets 471,000, white count 16,500.  Comprehensive metabolic panel included a BUN of 107 and a creatinine of 2.98 (previously BUN 17 and creatinine 1.29 on 04/23/2016).  Potassium was 5.5.  Calcium was 11.2 with an albumin of 3.0. AST was 104, ALT 124 and alkaline phosphatase 339. Lactic acid was 1.9.  Troponin was elevated at 0.09.    Urinalysis revealed 3+ blood, too numerous to count red blood cells, 6-30 white blood cells, and rare bacteria. Urinalysis revealed multiple species suggestive of contamination.  He was started on IV hydration because of acute renal insufficiency. Basic metabolic panel today includes a BUN of 73 and a creatinine of 1.56. Calcium is 9.8.  He is on Megace for appetite stimulation. Dietary has been consulted. He is on a Fentanyl patch for his back pain.   Past Medical History:  Diagnosis Date  . Hypertension   . Prostate cancer ([place])   . Skin cancer   . Stomach cancer ([place])   . Stroke [facility])     Past Surgical History:  Procedure Laterality Date  . VASCULAR SURGERY  Family History  Problem Relation Age of Onset  . Cancer Maternal Aunt   . Heart attack Maternal Grandfather     Social History:  reports that he quit smoking about 8 years ago. His smoking use included Cigarettes. He has never used smokeless  tobacco. He reports that he does not drink alcohol or use drugs.  He lives alone.  He is alone today.  Allergies: No Known Allergies  Medications Prior to Admission  Medication Sig Dispense Refill  . amLODipine (NORVASC) 5 MG tablet Take 5 mg by mouth daily.     Marland Kitchen aspirin EC 81 MG tablet Take 81 mg by mouth daily.     . benazepril (LOTENSIN) 20 MG tablet Take 20 mg by mouth daily.     . citalopram (CELEXA) 20 MG tablet Take by mouth.    . diphenoxylate-atropine (LOMOTIL) 2.5-0.025 MG tablet Take 1 tablet by mouth 4 (four) times daily as needed for diarrhea or loose stools. 30 tablet 0  . fentaNYL (DURAGESIC - DOSED MCG/HR) 25 MCG/HR patch Place 1 patch (25 mcg total) onto the skin every 3 (three) days. 10 patch 0  . Loratadine 10 MG CAPS Take 10 mg by mouth.     . megestrol (MEGACE) 40 MG tablet Take 1 tablet (40 mg total) by mouth daily. 30 tablet 3  . ondansetron (ZOFRAN) 8 MG tablet Take 1 tablet (8 mg total) by mouth every 8 (eight) hours as needed for nausea or vomiting. 30 tablet 2  . Oxycodone HCl 10 MG TABS Take 1-2 tablets (10-20 mg total) by mouth every 6 (six) hours as needed. 90 tablet 0  . promethazine (PHENERGAN) 25 MG tablet Take 1 tablet (25 mg total) by mouth every 6 (six) hours as needed for nausea or vomiting. 30 tablet 0  . simvastatin (ZOCOR) 20 MG tablet Take 20 mg by mouth daily.       Review of Systems: GENERAL:  Tired.  No fevers, sweats.  Weight loss of 26 pounds since 04/23/2016. PERFORMANCE STATUS (ECOG):  3 HEENT:  Denies any visual changes.  No runny nose, sore throat, mouth sores or tenderness. Lungs:  Some shortness of breath.  No cough.  No hemoptysis. Cardiac:  No chest pain, palpitations, orthopnea, or PND. GI:  Abdominal pain.  Nausea.  No vomiting.  Questionable diarrhea.  No constipation, melena or hematochezia. GU:  No urgency, frequency, dysuria, or hematuria. Musculoskeletal:  Back pain.  No joint pain.  No muscle tenderness. Extremities:  No pain  or swelling. Skin:  No rashes or skin changes. Neuro:  General weakness.  Some headache.  No focal numbness or weakness, balance or coordination issues. Endocrine:  No diabetes, thyroid issues, hot flashes or night sweats. Psych:  No mood changes, depression or anxiety. Pain:  Back pain. Review of systems:  All other systems reviewed and found to be negative.  Physical Exam:  Blood pressure 105/64, pulse 70, temperature 97.6 F (36.4 C), temperature source Oral, resp. rate 18, height _0  (1.651 m), weight 124 lb 11.2 oz (56.6 kg), SpO2 94 %.  GENERAL:  Thin cachetic gentleman lying comfortably on the medical unit in no acute distress. MENTAL STATUS:  Alert and oriented to person and place only.  Does not know month or year. HEAD:  Temporal wasting.  Normocephalic, atraumatic, face symmetric, no Cushingoid features. EYES:  Pupils equal round and reactive to light and accomodation.  No conjunctivitis or scleral icterus. ENT:  Oropharynx clear without lesion.  Edentulous.  Tongue  normal. Mucous membranes dry.  RESPIRATORY:  Clear to auscultation anteriorly without rales, wheezes or rhonchi. CARDIOVASCULAR:  Regular rate and rhythm without murmur, rub or gallop. ABDOMEN:  Soft, non-tender, with active bowel sounds, and no marked hepatomegaly (palpable > 5 fingerbreaths below left costal margin).  No spelnomegaly. SKIN:  Ecchymosis upper extremities.  No rashes, ulcers or lesions. EXTREMITIES: No edema, no skin discoloration or tenderness.  No palpable cords. LYMPH NODES: No palpable cervical, supraclavicular, axillary or inguinal adenopathy  NEUROLOGICAL: Follows commands.  Moves all 4 extremities. PSYCH: Lethargic.  Dozes off to sleep.   Results for orders placed or performed during the hospital encounter of 06/07/16 (from the past 48 hour(s))  Comprehensive metabolic panel     Status: Abnormal   Collection Time: 06/07/16 12:07 PM  Result Value Ref Range   Sodium 134 (L) 135 - 145 mmol/L    Potassium 5.5 (H) 3.5 - 5.1 mmol/L   Chloride 99 (L) 101 - 111 mmol/L   CO2 19 (L) 22 - 32 mmol/L   Glucose, Bld 224 (H) 65 - 99 mg/dL   BUN 107 (H) 6 - 20 mg/dL    Comment: RESULT CONFIRMED BY MANUAL DILUTION SDR   Creatinine, Ser 2.98 (H) 0.61 - 1.24 mg/dL   Calcium 11.2 (H) 8.9 - 10.3 mg/dL   Total Protein 7.7 6.5 - 8.1 g/dL   Albumin 3.0 (L) 3.5 - 5.0 g/dL   AST 104 (H) 15 - 41 U/L   ALT 124 (H) 17 - 63 U/L   Alkaline Phosphatase 339 (H) 38 - 126 U/L   Total Bilirubin 1.1 0.3 - 1.2 mg/dL   GFR calc non Af Amer 19 (L) >60 mL/min   GFR calc Af Amer 22 (L) >60 mL/min    Comment: (NOTE) The eGFR has been calculated using the CKD EPI equation. This calculation has not been validated in all clinical situations. eGFR's persistently <60 mL/min signify possible Chronic Kidney Disease.    Anion gap 16 (H) 5 - 15  CBC     Status: Abnormal   Collection Time: 06/07/16 12:07 PM  Result Value Ref Range   WBC 16.5 (H) 3.8 - 10.6 K/uL   RBC 4.93 4.40 - 5.90 MIL/uL   Hemoglobin 12.8 (L) 13.0 - 18.0 g/dL   HCT 39.3 (L) 40.0 - 52.0 %   MCV 79.7 (L) 80.0 - 100.0 fL   MCH 26.0 26.0 - 34.0 pg   MCHC 32.6 32.0 - 36.0 g/dL   RDW 15.9 (H) 11.5 - 14.5 %   Platelets 471 (H) 150 - 440 K/uL  Type and screen [facility]     Status: None   Collection Time: 06/07/16 12:07 PM  Result Value Ref Range   ABO/RH(D) O NEG    Antibody Screen NEG    Sample Expiration 06/10/2016   Culture, blood (routine x 2)     Status: None (Preliminary result)   Collection Time: 06/07/16  3:02 PM  Result Value Ref Range   Specimen Description BLOOD    Special Requests NONE    Culture NO GROWTH 2 DAYS    Report Status PENDING   Culture, blood (routine x 2)     Status: None (Preliminary result)   Collection Time: 06/07/16  3:02 PM  Result Value Ref Range   Specimen Description BLOOD LEFT HAND    Special Requests BOTTLES DRAWN AEROBIC AND ANAEROBIC 5CC    Culture NO GROWTH 2 DAYS    Report  Status PENDING  Lactic acid, plasma     Status: None   Collection Time: 06/07/16  3:02 PM  Result Value Ref Range   Lactic Acid, Venous 1.9 0.5 - 1.9 mmol/L  Troponin I     Status: Abnormal   Collection Time: 06/07/16  3:02 PM  Result Value Ref Range   Troponin I 0.09 (HH) <0.03 ng/mL    Comment: CRITICAL RESULT CALLED TO, READ BACK BY AND VERIFIED WITH NELLIE MONAR AT 1550 06/07/2016 BY TFK.   Urinalysis complete, with microscopic     Status: Abnormal   Collection Time: 06/07/16  4:31 PM  Result Value Ref Range   Color, Urine AMBER (A) YELLOW   APPearance CLOUDY (A) CLEAR   Glucose, UA NEGATIVE NEGATIVE mg/dL   Bilirubin Urine NEGATIVE NEGATIVE   Ketones, ur NEGATIVE NEGATIVE mg/dL   Specific Gravity, Urine 1.015 1.005 - 1.030   Hgb urine dipstick 3+ (A) NEGATIVE   pH 5.0 5.0 - 8.0   Protein, ur 100 (A) NEGATIVE mg/dL   Nitrite NEGATIVE NEGATIVE   Leukocytes, UA NEGATIVE NEGATIVE   RBC / HPF TOO NUMEROUS TO COUNT 0 - 5 RBC/hpf   WBC, UA 6-30 0 - 5 WBC/hpf   Bacteria, UA RARE (A) NONE SEEN   Squamous Epithelial / LPF 0-5 (A) NONE SEEN   Mucous PRESENT   Urine culture     Status: Abnormal   Collection Time: 06/07/16  4:31 PM  Result Value Ref Range   Specimen Description URINE, RANDOM    Special Requests NONE    Culture MULTIPLE SPECIES PRESENT, SUGGEST RECOLLECTION (A)    Report Status 06/09/2016 FINAL   Hemoglobin     Status: Abnormal   Collection Time: 06/07/16  8:33 PM  Result Value Ref Range   Hemoglobin 11.2 (L) 13.0 - 18.0 g/dL  Troponin I (q 6hr x 3)     Status: Abnormal   Collection Time: 06/07/16  8:33 PM  Result Value Ref Range   Troponin I 0.03 (HH) <0.03 ng/mL    Comment: CRITICAL VALUE NOTED. VALUE IS CONSISTENT WITH PREVIOUSLY REPORTED/CALLED VALUE.  TFK  Troponin I (q 6hr x 3)     Status: Abnormal   Collection Time: 06/08/16  2:51 AM  Result Value Ref Range   Troponin I 0.17 (HH) <0.03 ng/mL    Comment: CRITICAL VALUE NOTED. VALUE IS CONSISTENT WITH  PREVIOUSLY REPORTED/CALLED VALUE.PMH  Comprehensive metabolic panel     Status: Abnormal   Collection Time: 06/08/16  2:51 AM  Result Value Ref Range   Sodium 138 135 - 145 mmol/L   Potassium 4.8 3.5 - 5.1 mmol/L   Chloride 109 101 - 111 mmol/L   CO2 19 (L) 22 - 32 mmol/L   Glucose, Bld 156 (H) 65 - 99 mg/dL   BUN 102 (H) 6 - 20 mg/dL    Comment: RESULT CONFIRMED BY MANUAL DILUTION.PMH   Creatinine, Ser 2.49 (H) 0.61 - 1.24 mg/dL   Calcium 9.8 8.9 - 10.3 mg/dL   Total Protein 6.0 (L) 6.5 - 8.1 g/dL   Albumin 2.3 (L) 3.5 - 5.0 g/dL   AST 63 (H) 15 - 41 U/L   ALT 78 (H) 17 - 63 U/L   Alkaline Phosphatase 250 (H) 38 - 126 U/L   Total Bilirubin 0.8 0.3 - 1.2 mg/dL   GFR calc non Af Amer 24 (L) >60 mL/min   GFR calc Af Amer 28 (L) >60 mL/min    Comment: (NOTE) The eGFR has been calculated using  the CKD EPI equation. This calculation has not been validated in all clinical situations. eGFR's persistently <60 mL/min signify possible Chronic Kidney Disease.    Anion gap 10 5 - 15  CBC     Status: Abnormal   Collection Time: 06/08/16  2:51 AM  Result Value Ref Range   WBC 13.0 (H) 3.8 - 10.6 K/uL   RBC 4.11 (L) 4.40 - 5.90 MIL/uL   Hemoglobin 10.8 (L) 13.0 - 18.0 g/dL   HCT 06.0 (L) 52.8 - 03.6 %   MCV 79.1 (L) 80.0 - 100.0 fL   MCH 26.2 26.0 - 34.0 pg   MCHC 33.2 32.0 - 36.0 g/dL   RDW 60.0 (H) 65.9 - 01.9 %   Platelets 411 150 - 440 K/uL  Basic metabolic panel     Status: Abnormal   Collection Time: 06/09/16  4:47 AM  Result Value Ref Range   Sodium 137 135 - 145 mmol/L   Potassium 4.2 3.5 - 5.1 mmol/L   Chloride 109 101 - 111 mmol/L   CO2 19 (L) 22 - 32 mmol/L   Glucose, Bld 133 (H) 65 - 99 mg/dL   BUN 73 (H) 6 - 20 mg/dL   Creatinine, Ser 1.70 (H) 0.61 - 1.24 mg/dL   Calcium 9.8 8.9 - 15.2 mg/dL   GFR calc non Af Amer 42 (L) >60 mL/min   GFR calc Af Amer 49 (L) >60 mL/min    Comment: (NOTE) The eGFR has been calculated using the CKD EPI equation. This calculation has  not been validated in all clinical situations. eGFR's persistently <60 mL/min signify possible Chronic Kidney Disease.    Anion gap 9 5 - 15  CBC     Status: Abnormal   Collection Time: 06/09/16  4:47 AM  Result Value Ref Range   WBC 12.1 (H) 3.8 - 10.6 K/uL   RBC 4.20 (L) 4.40 - 5.90 MIL/uL   Hemoglobin 10.9 (L) 13.0 - 18.0 g/dL   HCT 66.7 (L) 33.8 - 83.2 %   MCV 78.8 (L) 80.0 - 100.0 fL   MCH 26.0 26.0 - 34.0 pg   MCHC 33.0 32.0 - 36.0 g/dL   RDW 62.3 (H) 56.6 - 88.7 %   Platelets 400 150 - 440 K/uL   Dg Lumbar Spine 2-3 Views  Result Date: 06/07/2016 CLINICAL DATA:  Fall. EXAM: LUMBAR SPINE - 2-3 VIEW COMPARISON:  PET-CT 05/30/2016 .  CT 04/15/2016 . FINDINGS: Degenerative changes lumbar spine. T11 mild compression fracture. This is new from prior studies P Minimal L1 anterior wedging, unchanged. No acute bony abnormality identified. Normal alignment mineralization. IMPRESSION: 1. T11 mild compression fracture. This is new from prior studies. Minimal anterior wedging L1, unchanged. 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 Diffuse degenerative change. 2. Aortoiliac atherosclerotic vascular disease. Electronically Signed   By: Maisie Fus  Register   On: 06/07/2016 16:03   Ct Head Wo Contrast  Result Date: 06/07/2016 CLINICAL DATA:  Unwitnessed fall. EXAM: CT HEAD WITHOUT CONTRAST TECHNIQUE: Contiguous axial images were obtained from the base of the skull through the vertex without intravenous contrast. COMPARISON:  None. FINDINGS: Brain: Minimal diffuse cortical atrophy is noted. Minimal chronic ischemic white matter disease is noted. No mass effect or midline shift is noted. Ventricular size is within normal limits. There is no evidence of mass lesion, hemorrhage or acute infarction. Vascular: Atherosclerosis of internal carotid arteries is noted. Skull: Bony calvarium appears intact. Sinuses/Orbits: Right maxillary sinusitis is noted. Other:  None.  IMPRESSION: Right maxillary sinusitis. Minimal diffuse cortical atrophy. Minimal chronic ischemic white matter disease. No acute intracranial abnormality seen. Electronically Signed   By: Marijo Conception, M.D.   On: 06/07/2016 16:13   Dg Chest Port 1 View  Result Date: 06/07/2016 CLINICAL DATA:  Patient was found down in his home this morning by a friend. Patient states he thinks he fell around 1am this morning. Patient has coffee ground looking emesis today with increased weakness and low back pain since fall. Patient has a HX of lung cancer. EXAM: PORTABLE CHEST 1 VIEW COMPARISON:  PET-CT, 05/30/2016 FINDINGS: Cardiac silhouette is normal in size and configuration. No mediastinal or hilar masses or evidence of adenopathy. Lungs are clear.  No pleural effusion or pneumothorax. Skeletal structures are demineralized but intact. IMPRESSION: No acute cardiopulmonary disease. Electronically Signed   By: Lajean Manes M.D.   On: 06/07/2016 15:59   Dg Abd 2 Views  Result Date: 06/07/2016 CLINICAL DATA:  Vomiting.  History of prostate and stomach cancer. EXAM: ABDOMEN - 2 VIEW COMPARISON:  CT of the abdomen 04/15/2016 FINDINGS: The bowel gas pattern is normal. There is no evidence of free air. The liver shadow is enlarged reaching the right iliac crest on supine images. There is an 11 mm and 5 mm calcific densities overlying the left renal shadow IMPRESSION: Abnormal appearance of the liver, which may be due to enlarging right lobe of the liver mass. Calcific densities overlying the left renal shadow, which likely represent renal calculi. Electronically Signed   By: Fidela Salisbury M.D.   On: 06/07/2016 17:28    Assessment:  The patient is a 75 y.o.  gentleman with widely metastatic poorly differentiated neuroendocrine tumor.  Disease is progressing rapidly.  Performance status is poor.  He is unable to care for himself.  He was admitted with acute renal insufficiency secondary to poor oral intake and  nausea and vomiting.  He has pain associated known liver metastasis and T11 compression fracture.  He is unable to care for himself.   Plan:   1.  Oncology:  Discussed with patient current issues.  He previously talked to Dr. Grayland Ormond about treatment.  He is still interested in treatment.  Plan was for carboplatin and etoposide.  Performance status is declining secondary to his malignancy.  He is currently unable to care for himself by his own admission.  Unclear if patient is a candidate for systemic chemotherapy.  Agree with palliative care consult.  2.  Renal:  Acute renal insufficiency resolving with IVF.  Hypercalcemia resolved.  Patient appears unable to meet his fluid goals independently.  Megace added for an appetite stimulant.    3.  Pain and Toxicology:  Patient complains of pain in his liver (mild) and back. Fentanyl patch initiated.  Oxycodone prn.  4.  Disposition:  Patient will likely not be able to return home for independent living.  Social work consult.   Thank you for allowing me to participate in BURLIE CAJAMARCA 's care.  I will follow him closely with you while hospitalized until Dr. Gary Fleet return on Monday, 06/10/2016.   Lequita Asal, MD  06/09/2016, 11:59 AM

## 2016-06-09 NOTE — Progress Notes (Signed)
Notified Dr Vianne Bulls that pt had a loose stool. Pt had 3 loose stools in the last 24hrs. Pt takes senokot and colace. Per MD to d/c senokot and colace, to check for C.diff and to initiate enteric precautions. To note, pt transferred to our unit 1C from room 259 and per previous documentation pt had loose stools in the last 24hrs.

## 2016-06-09 NOTE — Care Management Important Message (Signed)
Important Message  Patient Details  Name: Jonathon Benjamin MRN: [id number] Date of Birth: 1941/03/25   Medicare Important Message Given:  Yes    Jhanvi Drakeford A, RN 06/09/2016, 12:53 PM

## 2016-06-09 NOTE — Progress Notes (Signed)
Initial Nutrition Assessment  DOCUMENTATION CODES:   Severe malnutrition in context of chronic illness  INTERVENTION:  -Ensure Enlive po BID, each supplement provides 350 kcal and 20 grams of protein  NUTRITION DIAGNOSIS:   Malnutrition related to chronic illness as evidenced by severe depletion of muscle mass, severe depletion of body fat, percent weight loss.  GOAL:   Patient will meet greater than or equal to 90% of their needs  MONITOR:   PO intake, I & O's, Labs, Weight trends, Supplement acceptance  REASON FOR ASSESSMENT:   Consult Assessment of nutrition requirement/status  ASSESSMENT:   Jonathon Benjamin  is a 75 y.o. 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 Patient had some dark coffee grounds in his vomiting. Very weak  Spoke with Mr. Jonathon Benjamin briefly. He was unable to iterate much history, mumbled a lot. No family at bedside. He only ate 25% of his meal for breakfast. Per chart, exhibits severe 26#/17.3% wt loss over 1 month. Nutrition-Focused physical exam completed. Findings are severe fat depletion, severe muscle depletion, and no edema.  In pain all over - likely contributing to poor appetite. Labs and medications reviewed: Megace PO daily - doesn't seem to be helping at this point - likely related to pain.   Diet Order:  DIET DYS 3 Room service appropriate? Yes; Fluid consistency: Thin  Skin:  Reviewed, no issues (Stg I to sacrum)  Last BM:  10/7  Height:   Ht Readings from Last 1 Encounters:  06/07/16 5\' 5"  (1.651 m)    Weight:   Wt Readings from Last 1 Encounters:  06/09/16 124 lb 11.2 oz (56.6 kg)    Ideal Body Weight:  61.81 kg  BMI:  Body mass index is 20.75 kg/m.  Estimated Nutritional Needs:   Kcal:  1970-2250 calories  Protein:  73-96 gm  Fluid:  >/= 2L  EDUCATION NEEDS:   No education needs identified  at this time  Jonathon Benjamin. Jonathon Britain, MS, RD LDN Inpatient Clinical Dietitian Pager [phone]

## 2016-06-09 NOTE — Progress Notes (Signed)
[facility] [place] NAME: Jonathon Benjamin    MR#:  [id number]  DATE OF BIRTH:  [birth year]  SUBJECTIVE; patient still having lot of back pain/ continued to have poor by mouth intake.Marland Kitchen   CHIEF COMPLAINT:   Chief Complaint  Patient presents with  . GI Problem  . Weakness    REVIEW OF SYSTEMS:   Review of Systems  Constitutional: Positive for malaise/fatigue and weight loss. Negative for chills and fever.  HENT: Negative for hearing loss.   Eyes: Negative for blurred vision, double vision and photophobia.  Respiratory: Negative for cough, hemoptysis and shortness of breath.   Cardiovascular: Negative for palpitations, orthopnea and leg swelling.  Gastrointestinal: Positive for abdominal pain, heartburn and nausea. Negative for diarrhea and vomiting.  Genitourinary: Negative for dysuria and urgency.  Musculoskeletal: Positive for back pain. Negative for myalgias and neck pain.  Skin: Negative for rash.  Neurological: Positive for weakness. Negative for dizziness, focal weakness, seizures and headaches.  Psychiatric/Behavioral: Negative for memory loss. The patient does not have insomnia.    C DRUG ALLERGIES:  No Known Allergies  VITALS:  Blood pressure 105/64, pulse 70, temperature 97.6 F (36.4 C), temperature source Oral, resp. rate 18, height 5\' 5"  (1.651 m), weight 56.6 kg (124 lb 11.2 oz), SpO2 94 %.  PHYSICAL EXAMINATION:  GENERAL:  75 y.o.-year-old patient lying in the bed with no acute distress. Hoarseness of voice    EYES: Pupils equal, round, reactive to light and accommodation. No scleral icterus. Extraocular muscles intact.  HEENT: Head atraumatic, normocephalic. Oropharynx and nasopharynx clear.  NECK:  Supple, no jugular venous distention. No thyroid enlargement, no tenderness.  LUNGS: Normal breath sounds bilaterally, no wheezing, rales,rhonchi or crepitation. No use of accessory muscles of respiration.   CARDIOVASCULAR: S1, S2 normal. No murmurs, rubs, or gallops.  ABDOMEN:chronic  generalized abdominal tenderness present.BSpresent.no organomegaly,  No rebound tenderness present bowel sounds are present  EXTREMITIES: No pedal edema, cyanosis, or clubbing.  NEUROLOGIC:  Has severe back pain unable to do full neurological neurological exam because he is so uncomfortable with back pain  PSYCHIATRIC: The patient is alert and oriented x 3.  SKIN: No obvious rash, lesion, or ulcer.    LABORATORY PANEL:   CBC  Recent Labs Lab 06/09/16 0447  WBC 12.1*  HGB 10.9*  HCT 33.1*  PLT 400   ------------------------------------------------------------------------------------------------------------------  Chemistries   Recent Labs Lab 06/08/16 0251 06/09/16 0447  NA 138 137  K 4.8 4.2  CL 109 109  CO2 19* 19*  GLUCOSE 156* 133*  BUN 102* 73*  CREATININE 2.49* 1.56*  CALCIUM 9.8 9.8  AST 63*  --   ALT 78*  --   ALKPHOS 250*  --   BILITOT 0.8  --    ------------------------------------------------------------------------------------------------------------------  Cardiac Enzymes  Recent Labs Lab 06/08/16 0251  TROPONINI 0.17*   ------------------------------------------------------------------------------------------------------------------  RADIOLOGY:  Dg Lumbar Spine 2-3 Views  Result Date: 06/07/2016 CLINICAL DATA:  Fall. EXAM: LUMBAR SPINE - 2-3 VIEW COMPARISON:  PET-CT 05/30/2016 .  CT 04/15/2016 . FINDINGS: Degenerative changes lumbar spine. T11 mild compression fracture. This is new from prior studies P Minimal L1 anterior wedging, unchanged. No acute bony abnormality identified. Normal alignment mineralization. IMPRESSION: 1. T11 mild compression fracture. This is new from prior studies. Minimal anterior wedging L1, unchanged. 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 Diffuse degenerative change. 2. Aortoiliac  atherosclerotic vascular disease. Electronically Signed  By: Hobson   On: 06/07/2016 16:03   Ct Head Wo Contrast  Result Date: 06/07/2016 CLINICAL DATA:  Unwitnessed fall. EXAM: CT HEAD WITHOUT CONTRAST TECHNIQUE: Contiguous axial images were obtained from the base of the skull through the vertex without intravenous contrast. COMPARISON:  None. FINDINGS: Brain: Minimal diffuse cortical atrophy is noted. Minimal chronic ischemic white matter disease is noted. No mass effect or midline shift is noted. Ventricular size is within normal limits. There is no evidence of mass lesion, hemorrhage or acute infarction. Vascular: Atherosclerosis of internal carotid arteries is noted. Skull: Bony calvarium appears intact. Sinuses/Orbits: Right maxillary sinusitis is noted. Other: None. IMPRESSION: Right maxillary sinusitis. Minimal diffuse cortical atrophy. Minimal chronic ischemic white matter disease. No acute intracranial abnormality seen. Electronically Signed   By: Marijo Conception, M.D.   On: 06/07/2016 16:13   Dg Chest Port 1 View  Result Date: 06/07/2016 CLINICAL DATA:  Patient was found down in his home this morning by a friend. Patient states he thinks he fell around 1am this morning. Patient has coffee ground looking emesis today with increased weakness and low back pain since fall. Patient has a HX of lung cancer. EXAM: PORTABLE CHEST 1 VIEW COMPARISON:  PET-CT, 05/30/2016 FINDINGS: Cardiac silhouette is normal in size and configuration. No mediastinal or hilar masses or evidence of adenopathy. Lungs are clear.  No pleural effusion or pneumothorax. Skeletal structures are demineralized but intact. IMPRESSION: No acute cardiopulmonary disease. Electronically Signed   By: Lajean Manes M.D.   On: 06/07/2016 15:59   Dg Abd 2 Views  Result Date: 06/07/2016 CLINICAL DATA:  Vomiting.  History of prostate and stomach cancer. EXAM: ABDOMEN - 2 VIEW COMPARISON:  CT of the abdomen 04/15/2016 FINDINGS: The  bowel gas pattern is normal. There is no evidence of free air. The liver shadow is enlarged reaching the right iliac crest on supine images. There is an 11 mm and 5 mm calcific densities overlying the left renal shadow IMPRESSION: Abnormal appearance of the liver, which may be due to enlarging right lobe of the liver mass. Calcific densities overlying the left renal shadow, which likely represent renal calculi. Electronically Signed   By: Fidela Salisbury M.D.   On: 06/07/2016 17:28    EKG:   Orders placed or performed in visit on 04/15/11  . 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 Patient has chronic abdominal pain, diarrhea, nausea.: Patient admitted because of fall, found to have acute renal failure, UTI, T11 compression fracture.   #1 acute renal failure with ATN secondary to poor by mouth intake: Continue IV hydration, renal function is better. will involve palliative care to discuss the goals of treatment.The records from Dr. Grayland Ormond, patient to performance status is declining continues to poor appetite, increased nausea, diarrhea. Consult oncology also.  #2. Slightly elevated troponins likely secondary to fall, renal failure. EF 65% . #3. Low back pain: Continue fentanyl patch and increase the dose today. Continue oxycodone.  Poor appetite: On Megace. Continue that. Registered dietitian consult.  Cystitis: UA abnormal. Continue Rocephin. Urine culture showed mixed  Species.   All the records are reviewed and case discussed with Care Management/Social Workerr. Management plans discussed with the patient, family and they are in agreement   CODE STATUS: stable  TOTAL TIME TAKING CARE OF THIS PATIENT: 35  minutes.   POSSIBLE D/C IN 1-2  DAYS, DEPENDING  ON CLINICAL CONDITION.   Epifanio Lesches M.D on 06/09/2016 at 10:53 AM  Between 7am to 6pm - Pager -  [phone]  After 6pm go to [email or web address] - password [email or web address] [facility]  Office  [phone]  CC: Primary care physician; Sherrin Daisy, MD   Note: This dictation was prepared with Dragon dictation along with smaller phrase technology. Any transcriptional errors that result from this process are unintentional.

## 2016-06-09 NOTE — Progress Notes (Signed)
Patient transferring to room 104. Report called to [facility]

## 2016-06-09 NOTE — NC FL2 (Deleted)
[place] LEVEL OF CARE SCREENING TOOL     IDENTIFICATION  Patient Name: Jonathon Benjamin Birthdate: 1940/10/26 Sex: male Admission Date (Current Location): 06/07/2016  [place] and [place] Number:  Engineering geologist and Address:  [facility], [address] [place], [place] [address]      Provider Number: [id number]  Attending Physician Name and Address:  Epifanio Lesches, MD  Relative Name and Phone Number:       Current Level of Care: Hospital Recommended Level of Care: [place] Prior Approval Number:    Date Approved/Denied: 06/09/16 PASRR Number: [id number]  Discharge Plan: SNF    Current Diagnoses: Patient Active Problem List   Diagnosis Date Noted  . Pressure injury of skin 06/08/2016  . Dehydration 06/07/2016  . 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 Liver mass 04/22/2016    Orientation RESPIRATION BLADDER Height & Weight     Self, Time, Situation, Place  Normal Continent Weight: 124 lb 11.2 oz (56.6 kg) Height:  5\' 5"  (165.1 cm)  BEHAVIORAL SYMPTOMS/MOOD NEUROLOGICAL BOWEL NUTRITION STATUS      Continent    AMBULATORY STATUS COMMUNICATION OF NEEDS Skin   Extensive Assist Verbally Normal                       Personal Care Assistance Level of Assistance  Bathing, Feeding, Dressing Bathing Assistance: Maximum assistance Feeding assistance: Independent Dressing Assistance: Maximum assistance     Functional Limitations Info             SPECIAL CARE FACTORS FREQUENCY  PT (By licensed PT), OT (By licensed OT)     PT Frequency: 5X day 5X week OT Frequency: 5X day 5X week            Contractures Contractures Info: Present    Additional Factors Info                  Current Medications (06/09/2016):  This is the current hospital active medication list Current Facility-Administered Medications  Medication Dose Route Frequency  Provider Last Rate Last Dose  . acetaminophen (TYLENOL) tablet 650 mg  650 mg Oral Q6H PRN Hillary Bow, MD   650 mg at 06/08/16 1816   Or  . acetaminophen (TYLENOL) suppository 650 mg  650 mg Rectal Q6H PRN Srikar Sudini, MD      . albuterol (PROVENTIL) (2.5 MG/3ML) 0.083% nebulizer solution 2.5 mg  2.5 mg Nebulization Q2H PRN Srikar Sudini, MD      . bisacodyl (DULCOLAX) suppository 10 mg  10 mg Rectal Daily PRN Srikar Sudini, MD      . cefTRIAXone (ROCEPHIN) 1 g in dextrose 5 % 50 mL IVPB  1 g Intravenous Q24H Hillary Bow, MD   1 g at 06/08/16 2219  . citalopram (CELEXA) tablet 10 mg  10 mg Oral Daily Hillary Bow, MD   10 mg at 06/09/16 0939  . diphenoxylate-atropine (LOMOTIL) 2.5-0.025 MG per tablet 1 tablet  1 tablet Oral QID PRN Srikar Sudini, MD      . feeding supplement (ENSURE ENLIVE) (ENSURE ENLIVE) liquid 237 mL  237 mL Oral BID BM Epifanio Lesches, MD   237 mL at 06/09/16 1528  . fentaNYL (DURAGESIC - dosed mcg/hr) 50 mcg  50 mcg Transdermal Q72H Epifanio Lesches, MD   50 mcg at 06/09/16 1151  . megestrol (MEGACE) tablet 40 mg  40 mg Oral Daily Hillary Bow, MD   40 mg at  06/09/16 0939  . ondansetron (ZOFRAN) tablet 4 mg  4 mg Oral Q6H PRN Hillary Bow, MD       Or  . ondansetron (ZOFRAN) injection 4 mg  4 mg Intravenous Q6H PRN Hillary Bow, MD   4 mg at 06/07/16 2124  . oxyCODONE (Oxy IR/ROXICODONE) immediate release tablet 10 mg  10 mg Oral Q6H PRN Hillary Bow, MD   10 mg at 06/09/16 0343  . oxyCODONE (Oxy IR/ROXICODONE) immediate release tablet 5 mg  5 mg Oral Q4H PRN Hillary Bow, MD   5 mg at 06/09/16 0800  . polyethylene glycol (MIRALAX / GLYCOLAX) packet 17 g  17 g Oral Daily PRN Srikar Sudini, MD      . sodium chloride flush (NS) 0.9 % injection 3 mL  3 mL Intravenous Q12H Hillary Bow, MD   3 mL at 06/07/16 2114     Discharge Medications: Please see discharge summary for a list of discharge medications.  Relevant Imaging Results:  Relevant Lab  Results:   Additional Information  SS # [id number]  Zettie Pho, LCSW

## 2016-06-09 NOTE — Clinical Social Work Note (Signed)
Clinical Social Work Assessment  Patient Details  Name: Jonathon Benjamin MRN: [id number] Date of Birth: October 27, 1940  Date of referral:  06/09/16               Reason for consult:  Facility Placement                Permission sought to share information with:  Family Supports, Customer service manager Permission granted to share information::  Yes, Verbal Permission Granted  Name::     Alinda Money  Agency::  Area SNFs  Relationship::  daughter  Contact Information:  [phone]  Housing/Transportation Living arrangements for the past 2 months:  Mobile Home Source of Information:  Adult Children Patient Interpreter Needed:  None Criminal Activity/Legal Involvement Pertinent to Current Situation/Hospitalization:  No - Comment as needed Significant Relationships:  Adult Children, Community Support, [place] Lives with:  Self Do you feel safe going back to the place where you live?  Yes Need for family participation in patient care:  No ([place])  Care giving concerns: STR   Social Worker assessment / plan:  The patient was sleeping when the [place] visited his bedside. The patient's daughter, [name], was available via telephone . The CSW introduced self and role in dc planning.   The patient is baseline independent with all ADLs, medication management, driving, finances, and managing his home. The patient lives alone, and he uses a cane PRN. He presented at the [facility] ED with a possible GI bleed having been found down post-fall by a neighbor.  PT is recommending SNF for STR. The patient's daughter gave verbal permission to conduct bed search and noted [facility] as the preference. The patient's daughter understands that the patient is in a decline due to multiple cancers, and she is open to discussing hospice and palliative care when the time comes.     Employment status:  Retired Nurse, adult PT Recommendations:  [place] /  Referral to community resources:  [place]  Patient/Family's Response to care:  Patient's daughter was attentive and thanked CSW for contact.  Patient/Family's Understanding of and Emotional Response to Diagnosis, Current Treatment, and Prognosis:  The patient's daughter understands that the patient is in a decline due to multiple cancers, and she is open to discussing hospice and palliative care when the time comes.  Emotional Assessment Appearance:   (Patient sleeping) Attitude/Demeanor/Rapport:   (Patient sleeping) Affect (typically observed):   (Patient sleeping) Orientation:   (Patient sleeping) Alcohol / Substance use:  Never Used Psych involvement (Current and /or in the community):  No (Comment)  Discharge Needs  Concerns to be addressed:  Discharge Planning Concerns Readmission within the last 30 days:  No Current discharge risk:  Chronically ill Barriers to Discharge:  Continued Medical Work up   [employer], LCSW 06/09/2016, 3:37 PM

## 2016-06-10 ENCOUNTER — Telehealth: Payer: Self-pay | Admitting: *Deleted

## 2016-06-10 DIAGNOSIS — R627 Adult failure to thrive: Secondary | ICD-10-CM

## 2016-06-10 DIAGNOSIS — Z7189 Other specified counseling: Secondary | ICD-10-CM

## 2016-06-10 DIAGNOSIS — Z8546 Personal history of malignant neoplasm of prostate: Secondary | ICD-10-CM

## 2016-06-10 DIAGNOSIS — R197 Diarrhea, unspecified: Secondary | ICD-10-CM

## 2016-06-10 DIAGNOSIS — I10 Essential (primary) hypertension: Secondary | ICD-10-CM

## 2016-06-10 DIAGNOSIS — R978 Other abnormal tumor markers: Secondary | ICD-10-CM

## 2016-06-10 DIAGNOSIS — R112 Nausea with vomiting, unspecified: Secondary | ICD-10-CM

## 2016-06-10 DIAGNOSIS — Z66 Do not resuscitate: Secondary | ICD-10-CM

## 2016-06-10 DIAGNOSIS — R160 Hepatomegaly, not elsewhere classified: Secondary | ICD-10-CM

## 2016-06-10 DIAGNOSIS — C7A1 Malignant poorly differentiated neuroendocrine tumors: Secondary | ICD-10-CM

## 2016-06-10 DIAGNOSIS — Z515 Encounter for palliative care: Secondary | ICD-10-CM

## 2016-06-10 DIAGNOSIS — R630 Anorexia: Secondary | ICD-10-CM

## 2016-06-10 DIAGNOSIS — C787 Secondary malignant neoplasm of liver and intrahepatic bile duct: Secondary | ICD-10-CM

## 2016-06-10 DIAGNOSIS — Z79899 Other long term (current) drug therapy: Secondary | ICD-10-CM

## 2016-06-10 DIAGNOSIS — R7989 Other specified abnormal findings of blood chemistry: Secondary | ICD-10-CM

## 2016-06-10 DIAGNOSIS — Z87891 Personal history of nicotine dependence: Secondary | ICD-10-CM

## 2016-06-10 DIAGNOSIS — G893 Neoplasm related pain (acute) (chronic): Secondary | ICD-10-CM

## 2016-06-10 DIAGNOSIS — N289 Disorder of kidney and ureter, unspecified: Secondary | ICD-10-CM

## 2016-06-10 MED ORDER — CIPROFLOXACIN HCL 500 MG PO TABS
250.0000 mg | ORAL_TABLET | Freq: Two times a day (BID) | ORAL | Status: DC
  Administered 2016-06-10 – 2016-06-11 (×3): 250 mg via ORAL
  Filled 2016-06-10 (×3): qty 1

## 2016-06-10 MED ORDER — MEGESTROL ACETATE 400 MG/10ML PO SUSP
400.0000 mg | Freq: Every day | ORAL | Status: DC
  Administered 2016-06-10 – 2016-06-11 (×2): 400 mg via ORAL
  Filled 2016-06-10 (×2): qty 10

## 2016-06-10 MED ORDER — OXYCODONE HCL 5 MG PO TABS
10.0000 mg | ORAL_TABLET | ORAL | Status: DC | PRN
  Administered 2016-06-10 – 2016-06-11 (×2): 10 mg via ORAL
  Filled 2016-06-10 (×2): qty 2

## 2016-06-10 NOTE — NC FL2 (Signed)
[place] LEVEL OF CARE SCREENING TOOL     IDENTIFICATION  Patient Name: Jonathon Benjamin Birthdate: [birth year] Sex: male Admission Date (Current Location): 06/07/2016  [place] and [place] Number:  Engineering geologist and Address:  [facility], [address], [place], [place] [address]      Provider Number: [id number]  Attending Physician Name and Address:  Hillary Bow, MD  Relative Name and Phone Number:       Current Level of Care: Hospital Recommended Level of Care: [place] Prior Approval Number:    Date Approved/Denied:   PASRR Number: [id number]  Discharge Plan: SNF    Current Diagnoses: Patient Active Problem List   Diagnosis Date Noted  . Pressure injury of skin 06/08/2016  . Dehydration 06/07/2016  . Malignant poorly differentiated neuroendocrine carcinoma ([place]) 05/16/2016  . Liver mass 04/22/2016    Orientation RESPIRATION BLADDER Height & Weight     Self  Normal Incontinent Weight: 128 lb 11.2 oz (58.4 kg) Height:  5\' 5"  (165.1 cm)  BEHAVIORAL SYMPTOMS/MOOD NEUROLOGICAL BOWEL NUTRITION STATUS   (None)  (None) Incontinent Diet (DYS 3)  AMBULATORY STATUS COMMUNICATION OF NEEDS Skin   Extensive Assist Verbally Other (Comment) (Pressure Injury Stage I Sacrum)                       Personal Care Assistance Level of Assistance  Bathing, Feeding, Dressing Bathing Assistance: Limited assistance Feeding assistance: Independent Dressing Assistance: Limited assistance     Functional Limitations Info  Sight, Hearing, Speech Sight Info: Adequate Hearing Info: Adequate Speech Info: Adequate    SPECIAL CARE FACTORS FREQUENCY  PT (By licensed PT)     PT Frequency: 5X day 5X week              Contractures Contractures Info: Present    Additional Factors Info  Code Status, Allergies Code Status Info:  (Full Code) Allergies Info:  (No Known Allergies)           Current  Medications (06/10/2016):  This is the current hospital active medication list Current Facility-Administered Medications  Medication Dose Route Frequency Provider Last Rate Last Dose  . acetaminophen (TYLENOL) tablet 650 mg  650 mg Oral Q6H PRN Hillary Bow, MD   650 mg at 06/08/16 1816   Or  . acetaminophen (TYLENOL) suppository 650 mg  650 mg Rectal Q6H PRN Srikar Sudini, MD      . albuterol (PROVENTIL) (2.5 MG/3ML) 0.083% nebulizer solution 2.5 mg  2.5 mg Nebulization Q2H PRN Srikar Sudini, MD      . bisacodyl (DULCOLAX) suppository 10 mg  10 mg Rectal Daily PRN Srikar Sudini, MD      . cefTRIAXone (ROCEPHIN) 1 g in dextrose 5 % 50 mL IVPB  1 g Intravenous Q24H Hillary Bow, MD   1 g at 06/09/16 2116  . citalopram (CELEXA) tablet 10 mg  10 mg Oral Daily Hillary Bow, MD   10 mg at 06/10/16 1000  . diphenoxylate-atropine (LOMOTIL) 2.5-0.025 MG per tablet 1 tablet  1 tablet Oral QID PRN Srikar Sudini, MD      . feeding supplement (ENSURE ENLIVE) (ENSURE ENLIVE) liquid 237 mL  237 mL Oral BID BM Epifanio Lesches, MD   237 mL at 06/10/16 1000  . fentaNYL (DURAGESIC - dosed mcg/hr) 50 mcg  50 mcg Transdermal Q72H Epifanio Lesches, MD   50 mcg at 06/09/16 1151  . megestrol (MEGACE) tablet 40 mg  40 mg  Oral Daily Hillary Bow, MD   40 mg at 06/10/16 1000  . ondansetron (ZOFRAN) tablet 4 mg  4 mg Oral Q6H PRN Hillary Bow, MD       Or  . ondansetron (ZOFRAN) injection 4 mg  4 mg Intravenous Q6H PRN Hillary Bow, MD   4 mg at 06/07/16 2124  . oxyCODONE (Oxy IR/ROXICODONE) immediate release tablet 10 mg  10 mg Oral Q6H PRN Hillary Bow, MD   10 mg at 06/09/16 1952  . oxyCODONE (Oxy IR/ROXICODONE) immediate release tablet 5 mg  5 mg Oral Q4H PRN Hillary Bow, MD   5 mg at 06/10/16 1000  . polyethylene glycol (MIRALAX / GLYCOLAX) packet 17 g  17 g Oral Daily PRN Srikar Sudini, MD      . sodium chloride flush (NS) 0.9 % injection 3 mL  3 mL Intravenous Q12H Srikar Sudini, MD   3 mL at 06/10/16  1000     Discharge Medications: Please see discharge summary for a list of discharge medications.  Relevant Imaging Results:  Relevant Lab Results:   Additional Information  (SSN # [id number])  Lorenso Quarry Akyah Lagrange, LCSW

## 2016-06-10 NOTE — Progress Notes (Signed)
[place] at [place] NAME: Jonathon Benjamin    MR#:  [id number]  DATE OF BIRTH:  Jan 23, 1941  SUBJECTIVE  CHIEF COMPLAINT:   Chief Complaint  Patient presents with  . GI Problem  . Weakness    Abdomen pain is improved. Some nausea. Tolerating food. Poor appetite.  REVIEW OF SYSTEMS:   Review of Systems  Constitutional: Positive for malaise/fatigue and weight loss. Negative for chills and fever.  HENT: Negative for hearing loss.   Eyes: Negative for blurred vision, double vision and photophobia.  Respiratory: Negative for cough, hemoptysis and shortness of breath.   Cardiovascular: Negative for palpitations, orthopnea and leg swelling.  Gastrointestinal: Positive for abdominal pain, heartburn and nausea. Negative for diarrhea and vomiting.  Genitourinary: Negative for dysuria and urgency.  Musculoskeletal: Positive for back pain. Negative for myalgias and neck pain.  Skin: Negative for rash.  Neurological: Positive for weakness. Negative for dizziness, focal weakness, seizures and headaches.  Psychiatric/Behavioral: Negative for memory loss. The patient does not have insomnia.     DRUG ALLERGIES:  No Known Allergies  VITALS:  Blood pressure 119/70, pulse 70, temperature 97.7 F (36.5 C), temperature source Oral, resp. rate 16, height 5\' 5"  (1.651 m), weight 58.4 kg (128 lb 11.2 oz), SpO2 93 %.  PHYSICAL EXAMINATION:  GENERAL:  75 y.o.-year-old patient lying in the bed with no acute distress. Hoarseness of voice    EYES: Pupils equal, round, reactive to light and accommodation. No scleral icterus. Extraocular muscles intact.  HEENT: Head atraumatic, normocephalic. Oropharynx and nasopharynx clear.  NECK:  Supple, no jugular venous distention. No thyroid enlargement, no tenderness.  LUNGS: Normal breath sounds bilaterally, no wheezing, rales,rhonchi or crepitation. No use of accessory muscles of respiration.   CARDIOVASCULAR: S1, S2 normal. No murmurs, rubs, or gallops.  ABDOMEN:chronic  generalized abdominal tenderness present.BSpresent.no organomegaly, No rebound tenderness present bowel sounds are present Palpable mass  EXTREMITIES: No pedal edema, cyanosis, or clubbing.  NEUROLOGIC:  Has severe back pain unable to do full neurological neurological exam because he is so uncomfortable with back pain  PSYCHIATRIC: The patient is alert and oriented x 3.  SKIN: No obvious rash, lesion, or ulcer.   LABORATORY PANEL:   CBC  Recent Labs Lab 06/09/16 0447  WBC 12.1*  HGB 10.9*  HCT 33.1*  PLT 400   ------------------------------------------------------------------------------------------------------------------  Chemistries   Recent Labs Lab 06/08/16 0251 06/09/16 0447  NA 138 137  K 4.8 4.2  CL 109 109  CO2 19* 19*  GLUCOSE 156* 133*  BUN 102* 73*  CREATININE 2.49* 1.56*  CALCIUM 9.8 9.8  AST 63*  --   ALT 78*  --   ALKPHOS 250*  --   BILITOT 0.8  --    ------------------------------------------------------------------------------------------------------------------  Cardiac Enzymes  Recent Labs Lab 06/08/16 0251  TROPONINI 0.17*   ------------------------------------------------------------------------------------------------------------------  RADIOLOGY:  No results found.  EKG:   Orders placed or performed in visit on 04/15/11  . 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 Patient has chronic abdominal pain, diarrhea, nausea.: Patient admitted because of fall, found to have acute renal failure, UTI, T11 compression fracture.   # Acute renal failure with ATN secondary to poor by mouth intake  renal function is better.  performance status is declining continues to have poor appetite, increased nausea, diarrhea. Appreciate palliative care and oncology  input  #  Slightly elevated troponins likely secondary to fall, renal failure. EF 65% . 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 Continue oxycodone.  # UTI with multiple organisms Change to cipro for 2 more days  # severe protein calorie malnutrition Megace Ensure   All the records are reviewed and case discussed with Care Management/Social Workerr. Management plans discussed with the patient, family and they are in agreement   CODE STATUS: stable  TOTAL TIME TAKING CARE OF THIS PATIENT: 35  minutes.   D/C in AM to SNF   Hillary Bow R M.D on 06/10/2016 at 2:39 PM  Between 7am to 6pm - Pager - [phone]  After 6pm go to [email or web address] - password [email or web address]  Office  [phone]  CC: Primary care physician; Sherrin Daisy, MD   Note: This dictation was prepared with Dragon dictation along with smaller phrase technology. Any transcriptional errors that result from this process are unintentional.

## 2016-06-10 NOTE — Progress Notes (Signed)
[place] responded to an OR for Pt in 104. CH and daughter arrived at the same time. Pt appeared groggy and had difficulty communicating. Daughter was in need of information concerning her fathers recent reports. [place] asked the nurse to speak with the daughter. The nurse in turn paged the DR and case workers. The Dr. Kathleen Lime on the phone and the case workers arrived shortly afterwards. CH provided the ministry of advocacy, empathetic listening, and prayer. CH is available for follow up as needed.    06/10/16 1900  Clinical Encounter Type  Visited With Patient;Patient and family together;Health care provider  Visit Type Initial;Spiritual support;Critical Care  Referral From Nurse  Consult/Referral To Chaplain  Spiritual Encounters  Spiritual Needs Prayer;Emotional  Stress Factors  Patient Stress Factors Exhausted;Health changes;Major life changes  Family Stress Factors Exhausted;Lack of knowledge

## 2016-06-10 NOTE — Progress Notes (Signed)
CSW presented bed offers to patient's daughter. Stated she'd like to see if [facility] would take patient. CSW called Sharyn Lull- Admissions Coordinator at [facility] and asked if they can review patient's referral. Awaiting response. Per MD patient will be ready to discharge tomorrow 06/11/16. CSW started [employer]. CSW will continue to follow and assist.  Ernest Pine, MSW, LCSW, [place] Social Worker [phone]

## 2016-06-10 NOTE — Consult Note (Signed)
Consultation Note Date: 06/10/2016   Patient Name: Jonathon Benjamin  DOB: 12-20-1940  MRN: [id number]  Age / Sex: 75 y.o., male  PCP: Jonathon Daisy, MD Referring Physician: Hillary Bow, MD  Reason for Consultation: Establishing goals of care  HPI/Patient Profile: 75 y.o. male with past medical history of hypertension, stroke, and recent diagnosis of metastatic poorly differentiated neuroendocrine tumor admitted on 06/07/2016 after patient was found down at home by a friend. Abdomen and pelvic CT scan on 04/15/2016 revealed a 14 cm mass invading the gallbladder as well as a 7 cm right liver mass, and bilateral adrenal metastasis. CT-guided liver biopsy on 05/07/2016 showing carcinoma with neuroendocrine differentiation. PET scan unable to be completed due to pain. Seen by Dr. Grayland Benjamin and on fentanyl patch 73mg and oxycodone 162mq6h prn. Upon admission, diagnosed with acute renal failure, UTI, and T11 compressin fracture. Renal function has improved but overall poor prognosis with poor appetite and functional decline. Palliative Medicine consultation for goals of care.    Clinical Assessment and Goals of Care: Initially spoke with daughter via telephone and then met with her at bedside. She just found out about her father's cancer diagnosis two weeks ago. He did not want to tell her and make her upset. Daughter, Jonathon Benjamin an only child. The patient does not have a spouse. Jonathon Benjamin a week and often it is when he visits her at work because he did not want her coming over to his mobile home. He has been living at home but both the patient and Jonathon Benjamin me that he is not able to return home because he is very weak and has had multiple falls.   Jonathon Benjamin her father has been a very independent, hardworking, and stubborn individual. 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   Jonathon Benjamin been preparing for her father to go to rehab and then start chemotherapy when he builds his strength up. She tells me he has not been eating but will drink boost. Jonathon Connnd patient both understand that at this point, he would not be able to tolerate chemotherapy. During the conversation, patient states he feels like he is dying and is accepting this. Jonathon Connecomes tearful and would like her father to move forward with rehab. Discussed that the patient may not be able to even participate in rehab.   Discussed with Jonathon Connhat the patient may be eligible for residential hospice where the focus would turn to comfort measures only. She is hesitant to accept this pathway but does not wish for her father to suffer or be in pain. Jonathon Connould like input from Dr. FiGrayland OrmondDr. FiGrayland Ormondo [facility].   Discussed code status with patient and Jonathon Benjamin. The patient would not want to be  resuscitated. Jonathon Benjamin agrees with this.   Provided emotional support. Will provide a [place] book to [name].   NEXT OF KIN-daughter, Jonathon Benjamin    SUMMARY OF RECOMMENDATIONS    Discussed code status with patient and daughter. He does not want to be resuscitated or on life support if he were to die. Changed code status to DO NOT RESUSCITATE. Daughter agrees with this decision.   Discussed the seriousness of his cancer and that it is aggressive. Patient and daughter understand he cannot start chemotherapy with his current functional and nutritional decline.   Discussed rehab vs. Residential hospice. Daughter wants rehab at this time but understands he may not be willing or able to participate. Will clarify after discussion with Dr. Grayland Benjamin.   Dr. Grayland Benjamin to talk with daughter tomorrow morning 10/10  at 8:00am.   Updated Dr. Johnney Benjamin on discharge to rehab. Further decisions to be made tomorrow regarding disposition. Patient may be eligible for residential hospice with diagnosis of metastatic cancer as well as severe functional and nutritional status decline.   PMT to follow-up with daughter, Jonathon Benjamin tomorrow 10/10 in afternoon.   Code Status/Advance Care Planning:  DNR   Symptom Management:   Pain-Oxycodone 68m PO q4h prn moderate to severe pain.  Continue Fentanyl patch 564m q72h.  Palliative Prophylaxis:   Aspiration, Delirium Protocol, Frequent Pain Assessment and Turn Reposition  Psycho-social/Spiritual:   Desire for further Chaplaincy support:no  Additional Recommendations: Caregiving  Support/Resources and Education on Hospice  Prognosis:   < 4 weeks in the setting of metastatic cancer and severe functional and nutritional decline.   Discharge Planning: To Be Determined Rehab vs. Residential hospice     Primary Diagnoses: Present on Admission: . Dehydration   I have reviewed the medical record, interviewed the patient and family, and examined the patient. The following aspects are pertinent.  Past Medical History:  Diagnosis Date  . Hypertension   . Prostate cancer ([facility]  . Skin cancer   . Stomach cancer ([facility] [place]  . [facility]   Social History   Social History  . Marital status: Married    Spouse name: N/A  . Number of children: N/A  . Years of education: N/A   Social History Main Topics  . Smoking status: Former Smoker    Types: Cigarettes    Quit date: 05/07/2008  . Smokeless tobacco: Never Used  . Alcohol use No  . Drug use: No  . Sexual activity: Not Asked   Other Topics Concern  . None   Social History Narrative  . None   Family History  Problem Relation Age of Onset  . Cancer Maternal Aunt   . Heart attack Maternal Grandfather    Scheduled Meds: . ciprofloxacin  250 mg Oral BID  . citalopram  10 mg Oral Daily  .  feeding supplement (ENSURE ENLIVE)  237 mL Oral BID BM  . fentaNYL  50 mcg Transdermal Q72H  . megestrol  400 mg Oral Daily  . sodium chloride flush  3 mL Intravenous Q12H   Continuous Infusions:  PRN Meds:.acetaminophen **OR** acetaminophen, albuterol, bisacodyl, diphenoxylate-atropine, ondansetron **OR** ondansetron (ZOFRAN) IV, oxyCODONE, polyethylene glycol Medications Prior to Admission:  Prior to Admission medications   Medication Sig Start Date End Date Taking? Authorizing Provider  amLODipine (NORVASC) 5 MG tablet Take 5 mg by mouth daily.  04/17/16   Historical Provider, MD  aspirin EC 81 MG tablet Take 81 mg by mouth daily.     Historical Provider, MD  benazepril (LOTENSIN) 20 MG tablet Take 20 mg by mouth daily.  04/17/16   Historical Provider, MD  citalopram (CELEXA) 20 MG tablet Take by mouth. 04/17/16   Historical Provider, MD  diphenoxylate-atropine (LOMOTIL) 2.5-0.025 MG tablet Take 1 tablet by mouth 4 (four) times daily as needed for diarrhea or loose stools. 05/13/16   Lloyd Huger, MD  fentaNYL (DURAGESIC - DOSED MCG/HR) 25 MCG/HR patch Place 1 patch (25 mcg total) onto the skin every 3 (three) days. 05/30/16   Lloyd Huger, MD  Loratadine 10 MG CAPS Take 10 mg by mouth.  02/02/16   Historical Provider, MD  megestrol (MEGACE) 40 MG tablet Take 1 tablet (40 mg total) by mouth daily. 05/20/16   Lloyd Huger, MD  ondansetron (ZOFRAN) 8 MG tablet Take 1 tablet (8 mg total) by mouth every 8 (eight) hours as needed for nausea or vomiting. 05/09/16   Lloyd Huger, MD  Oxycodone HCl 10 MG TABS Take 1-2 tablets (10-20 mg total) by mouth every 6 (six) hours as needed. 05/30/16   Lloyd Huger, MD  promethazine (PHENERGAN) 25 MG tablet Take 1 tablet (25 mg total) by mouth every 6 (six) hours as needed for nausea or vomiting. 05/13/16   Lloyd Huger, MD  simvastatin (ZOCOR) 20 MG tablet Take 20 mg by mouth daily.  04/17/16   Historical Provider, MD   No Known  Allergies Review of Systems  Constitutional: Positive for activity change, appetite change, fatigue and unexpected weight change.  Musculoskeletal:       Generalized pain  Neurological: Positive for weakness.  All other systems reviewed and are negative.   Physical Exam  Constitutional: He is oriented to person, place, and time. He is cooperative. He appears ill.  Cardiovascular: Regular rhythm and normal heart sounds.   Pulmonary/Chest: Effort normal. No accessory muscle usage. No tachypnea. No respiratory distress.  Abdominal: Soft. Bowel sounds are normal. He exhibits no distension. There is no tenderness.  Neurological: He is alert and oriented to person, place, and time.  Skin: Skin is warm and dry.  Psychiatric: His speech is normal. He is withdrawn.  Nursing note and vitals reviewed.   Vital Signs: BP 119/70 (BP Location: Left Arm)   Pulse 70   Temp 97.7 F (36.5 C) (Oral)   Resp 16   Ht 5' 5"  (1.651 m)   Wt 58.4 kg (128 lb 11.2 oz)   SpO2 93%   BMI 21.42 kg/m  Pain Assessment: 0-10 POSS *See Group Information*: 1-Acceptable,Awake and alert Pain Score: 0-No pain   SpO2: SpO2: 93 % O2 Device:SpO2: 93 % O2 Flow Rate: .   IO: Intake/output summary:   Intake/Output Summary (Last 24 hours) at 06/10/16 1749 Last data filed at 06/10/16 1200  Gross per 24 hour  Intake              240 ml  Output                0 ml  Net              240 ml    LBM: Last BM Date: 06/09/16 Baseline Weight: Weight: 60.6 kg (133 lb 8 oz) Most recent weight: Weight: 58.4 kg (128 lb 11.2 oz)     Palliative Assessment/Data: PPS 20%   Flowsheet Rows   Flowsheet Row Most Recent Value  Intake Tab  Referral Department  Hospitalist  Unit at Time of Referral  Oncology Unit  Palliative Care Primary  Diagnosis  Cancer  Date Notified  06/09/16  Palliative Care Type  New Palliative care  Reason for referral  Clarify Goals of Care  Date of Admission  06/07/16  Date first seen by  Palliative Care  06/10/16  # of days Palliative referral response time  1 Day(s)  # of days IP prior to Palliative referral  2  Clinical Assessment  Palliative Performance Scale Score  20%  Psychosocial & Spiritual Assessment  Palliative Care Outcomes  Patient/Family meeting held?  Yes  Who was at the meeting?  [place] goals of care, Counseled regarding hospice, Improved pain interventions, Changed CPR status      Time In: 1600 Time Out: 1720 Time Total: 59mn Greater than 50%  of this time was spent counseling and coordinating care related to the above assessment and plan.  Signed by:  MIhor Dow FNP-C Palliative Medicine Team  Phone: [phone]: [phone]  Please contact Palliative Medicine Team phone at [phone] questions and concerns.  For individual provider: See AShea Evans

## 2016-06-10 NOTE — Plan of Care (Signed)
Problem: Pain Managment: Goal: General experience of comfort will improve Outcome: Progressing Patient is received good pain relief from prn pain meds   Problem: Physical Regulation: Goal: Will remain free from infection Outcome: Progressing Patient was afebrile this shift   Problem: Nutrition: Goal: Adequate nutrition will be maintained Outcome: Adequate for Discharge Megace suspension added to improve appetite continue to monitor

## 2016-06-10 NOTE — Progress Notes (Signed)
Physical Therapy Treatment Patient Details Name: Jonathon Benjamin MRN: [id number] DOB: [birth year] Today's Date: 06/10/2016    History of Present Illness Pt admitted for dehydration. Pt with complaints of weakness and was found on floor at home. Pt with slightly elevated troponin, however cleared by MD. 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 Pt now with acute T11 mild compression fracture. Pt is poor historian, unsure of accurate history.    PT Comments    Pt lethargic and mildly confused today. Upon arrival pt side lying with hands around right railing pushing on rail; states he is trying to lower bed. Pt assisted and educated on use of bed controls. Pt resistant to therapy stating, "can't I just lie here". Pt encouraged to participate in exercises. Pt oriented x 2 only; flat affect. Pt requires assist for exercises and tolerates minimal work only. Pt's level of lethargy and confusion does not improve during exercises. Pt does not wish in chair and deemed unsafe at this time as well. Continue PT to progress strength, endurance, and participation to improve all functional mobility.   Follow Up Recommendations  SNF     Equipment Recommendations       Recommendations for Other Services       Precautions / Restrictions Precautions Precautions: Fall Restrictions Weight Bearing Restrictions: No    Mobility  Bed Mobility               General bed mobility comments: Not tested; pt refuses and difficulty following insructions  Transfers                    Ambulation/Gait                 Stairs            Wheelchair Mobility    Modified Rankin (Stroke Patients Only)       Balance                                    Cognition Arousal/Alertness: Lethargic Behavior During Therapy: Flat affect Overall Cognitive Status: No family/caregiver present to determine baseline cognitive functioning (A/O x 2 (name  and birthdate))                      Exercises General Exercises - Lower Extremity Ankle Circles/Pumps: AAROM;Both;10 reps;Supine Quad Sets: Strengthening;Both;10 reps;Supine (does not perform full contraction) Gluteal Sets: Other (comment) (attempted; does not comprehend exercse) Short Arc Quad: AAROM;AROM;Both;10 reps;Supine Heel Slides: AAROM;Both;10 reps;Supine Hip ABduction/ADduction: AAROM;Both;10 reps;Supine Straight Leg Raises: AAROM;PROM;10 reps;Supine;Both    General Comments        Pertinent Vitals/Pain Pain Assessment: No/denies pain    Home Living                      Prior Function            PT Goals (current goals can now be found in the care plan section) Progress towards PT goals: Not progressing toward goals - comment    Frequency    Min 2X/week      PT Plan Current plan remains appropriate    Co-evaluation             End of Session   Activity Tolerance: Patient limited by lethargy;Other (comment) (confusion) Patient left: in bed;with call bell/phone within reach;with bed alarm set  Time: [id number] PT Time Calculation (min) (ACUTE ONLY): 18 min  Charges:  $Therapeutic Exercise: 8-22 mins                    G Codes:      Larae Grooms, PTA 06/10/2016, 1:43 PM

## 2016-06-10 NOTE — Telephone Encounter (Signed)
Pt possibly going on hospice. I'll let you know if it needs to be rescheduled

## 2016-06-10 NOTE — Telephone Encounter (Signed)
Cannot do PET for tomorrow because patient is in hospital

## 2016-06-10 NOTE — Progress Notes (Signed)
[place]  [phone]) [phone] [phone]  ID: Jonathon Benjamin OB: Jul 01, 1941  MR#: [id number]  [id number]  Patient Care Team: Sherrin Daisy, MD as PCP - General (Family Medicine) Clent Jacks, RN as Registered Nurse Katha Cabal, MD as Consulting Physician (Vascular Surgery)  CHIEF COMPLAINT: Failure to thrive, pain, progressive stage IV neuroendocrine malignancy.  INTERVAL HISTORY: Patient's performance status continues to decline. He is lethargic today, but arousable. His only complaint is of continued abdominal pain. He has a poor appetite and continues to lose weight.  REVIEW OF SYSTEMS:   Review of Systems  Constitutional: Positive for malaise/fatigue and weight loss. Negative for fever.  Respiratory: Negative.  Negative for cough and shortness of breath.   Cardiovascular: Negative.  Negative for chest pain and leg swelling.  Gastrointestinal: Positive for abdominal pain.  Genitourinary: Negative.   Musculoskeletal: Positive for back pain.  Neurological: Positive for weakness.  Psychiatric/Behavioral: Positive for depression. The patient is nervous/anxious.     As per HPI. Otherwise, a complete review of systems is negative.  PAST MEDICAL HISTORY: Past Medical History:  Diagnosis Date  . Hypertension   . Prostate cancer ([place])   . Skin cancer   . Stomach cancer ([place])   . Stroke [facility])     PAST SURGICAL HISTORY: Past Surgical History:  Procedure Laterality Date  . VASCULAR SURGERY      FAMILY HISTORY: Family History  Problem Relation Age of Onset  . Cancer Maternal Aunt   . Heart attack Maternal Grandfather     ADVANCED DIRECTIVES (Y/N):  @ADVDIR @  HEALTH MAINTENANCE: Social History  Substance Use Topics  . Smoking status: Former Smoker    Types: Cigarettes    Quit date: 05/07/2008  . Smokeless tobacco: Never Used  . Alcohol use No     Colonoscopy:  PAP:  Bone density:  Lipid panel:  No Known  Allergies  Current Facility-Administered Medications  Medication Dose Route Frequency Provider Last Rate Last Dose  . acetaminophen (TYLENOL) tablet 650 mg  650 mg Oral Q6H PRN Hillary Bow, MD   650 mg at 06/08/16 1816   Or  . acetaminophen (TYLENOL) suppository 650 mg  650 mg Rectal Q6H PRN Srikar Sudini, MD      . albuterol (PROVENTIL) (2.5 MG/3ML) 0.083% nebulizer solution 2.5 mg  2.5 mg Nebulization Q2H PRN Srikar Sudini, MD      . bisacodyl (DULCOLAX) suppository 10 mg  10 mg Rectal Daily PRN Srikar Sudini, MD      . ciprofloxacin (CIPRO) tablet 250 mg  250 mg Oral BID Hillary Bow, MD   250 mg at 06/10/16 1306  . citalopram (CELEXA) tablet 10 mg  10 mg Oral Daily Hillary Bow, MD   10 mg at 06/10/16 1000  . diphenoxylate-atropine (LOMOTIL) 2.5-0.025 MG per tablet 1 tablet  1 tablet Oral QID PRN Srikar Sudini, MD      . feeding supplement (ENSURE ENLIVE) (ENSURE ENLIVE) liquid 237 mL  237 mL Oral BID BM Epifanio Lesches, MD   237 mL at 06/10/16 1000  . fentaNYL (DURAGESIC - dosed mcg/hr) 50 mcg  50 mcg Transdermal Q72H Epifanio Lesches, MD   50 mcg at 06/09/16 1151  . megestrol (MEGACE) 400 MG/10ML suspension 400 mg  400 mg Oral Daily Srikar Sudini, MD      . ondansetron (ZOFRAN) tablet 4 mg  4 mg Oral Q6H PRN Hillary Bow, MD       Or  . ondansetron (ZOFRAN) injection 4 mg  4 mg Intravenous Q6H PRN Hillary Bow, MD   4 mg at 06/07/16 2124  . oxyCODONE (Oxy IR/ROXICODONE) immediate release tablet 10 mg  10 mg Oral Q4H PRN Basilio Cairo, NP      . polyethylene glycol (MIRALAX / GLYCOLAX) packet 17 g  17 g Oral Daily PRN Srikar Sudini, MD      . sodium chloride flush (NS) 0.9 % injection 3 mL  3 mL Intravenous Q12H Srikar Sudini, MD   3 mL at 06/10/16 1000    OBJECTIVE: Vitals:   06/10/16 1223 06/10/16 1257  BP: (!) 171/66 119/70  Pulse: 78 76  Resp: 16   Temp: 97.7 F (36.5 C)      Body mass index is 21.42 kg/m.    ECOG FS:3 - Symptomatic, >50% confined to  bed  General: Ill-appearing, mild distress secondary to pain.  Eyes: Pink conjunctiva, anicteric sclera. Lungs: Clear to auscultation bilaterally. Heart: Regular rate and rhythm. No rubs, murmurs, or gallops. Abdomen: Tender to palpation in right upper quadrant, normoactive bowel sounds. Musculoskeletal: No edema, cyanosis, or clubbing. Neuro: Lethargic but arousable.  Cranial nerves grossly intact. Skin: No rashes or petechiae noted. Psych: Normal affect.   LAB RESULTS:  Lab Results  Component Value Date   NA 137 06/09/2016   K 4.2 06/09/2016   CL 109 06/09/2016   CO2 19 (L) 06/09/2016   GLUCOSE 133 (H) 06/09/2016   BUN 73 (H) 06/09/2016   CREATININE 1.56 (H) 06/09/2016   CALCIUM 9.8 06/09/2016   PROT 6.0 (L) 06/08/2016   ALBUMIN 2.3 (L) 06/08/2016   AST 63 (H) 06/08/2016   ALT 78 (H) 06/08/2016   ALKPHOS 250 (H) 06/08/2016   BILITOT 0.8 06/08/2016   GFRNONAA 42 (L) 06/09/2016   GFRAA 49 (L) 06/09/2016    Lab Results  Component Value Date   WBC 12.1 (H) 06/09/2016   NEUTROABS 6.9 (H) 04/23/2016   HGB 10.9 (L) 06/09/2016   HCT 33.1 (L) 06/09/2016   MCV 78.8 (L) 06/09/2016   PLT 400 06/09/2016     STUDIES: Dg Lumbar Spine 2-3 Views  Result Date: 06/07/2016 CLINICAL DATA:  Fall. EXAM: LUMBAR SPINE - 2-3 VIEW COMPARISON:  PET-CT 05/30/2016 .  CT 04/15/2016 . FINDINGS: Degenerative changes lumbar spine. T11 mild compression fracture. This is new from prior studies P Minimal L1 anterior wedging, unchanged. No acute bony abnormality identified. Normal alignment mineralization. IMPRESSION: 1. T11 mild compression fracture. This is new from prior studies. Minimal anterior wedging L1, unchanged. 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 Diffuse degenerative change. 2. Aortoiliac atherosclerotic vascular disease. Electronically Signed   By: Marcello Moores  Register   On: 06/07/2016 16:03   Ct Head Wo Contrast  Result Date:  06/07/2016 CLINICAL DATA:  Unwitnessed fall. EXAM: CT HEAD WITHOUT CONTRAST TECHNIQUE: Contiguous axial images were obtained from the base of the skull through the vertex without intravenous contrast. COMPARISON:  None. FINDINGS: Brain: Minimal diffuse cortical atrophy is noted. Minimal chronic ischemic white matter disease is noted. No mass effect or midline shift is noted. Ventricular size is within normal limits. There is no evidence of mass lesion, hemorrhage or acute infarction. Vascular: Atherosclerosis of internal carotid arteries is noted. Skull: Bony calvarium appears intact. Sinuses/Orbits: Right maxillary sinusitis is noted. Other: None. IMPRESSION: Right maxillary sinusitis. Minimal diffuse cortical atrophy. Minimal chronic ischemic white matter disease. No acute intracranial abnormality seen. Electronically Signed   By: Marijo Conception, M.D.   On:  06/07/2016 16:13   Nm Pet Image Initial (pi) Skull Base To Thigh  Result Date: 05/31/2016 CLINICAL DATA:  Initial treatment strategy for malignant neuroendocrine carcinoma in liver. EXAM: NUCLEAR MEDICINE PET SKULL BASE TO THIGH TECHNIQUE: 12.8 mCi F-18 FDG was injected intravenously. CT data was successfully obtained and used for attenuation correction and anatomic localization from the skullbase through the upper thighs. Full-ring PET imaging was performed beginning at the pelvis. PET images were obtained through the pelvis, however the patient was in severe pain and could not complete the scan. PET images were not obtained through the neck, chest, or abdomen FASTING BLOOD GLUCOSE:  Value: 187 mg/dl COMPARISON:  AP CT on 04/15/2016 FINDINGS: NECK No PET images were obtained through the neck, as described above. Noncontrast CT images through the neck show no evidence of masses or lymphadenopathy. CHEST No PET images were obtained through the chest, as described above. Noncontrast CT images through the chest show several sub-cm mediastinal lymph nodes in  the left paratracheal region, largest measuring 7 mm on image 87/3. No pathologically enlarged lymph nodes identified on this unenhanced exam. Aortic atherosclerosis and coronary artery calcification noted. Moderate emphysema is noted. No suspicious pulmonary nodules or masses identified. No evidence of pulmonary infiltrate or pleural effusion. ABDOMEN/PELVIS No PET images were obtained through the abdomen, as described above. Noncontrast CT images through the abdomen show several low-attenuation liver masses, largest involving both the right and left hepatic lobes which measures approximately 14.9 x 14.4 cm on image 173/3 compared to 13.5 by 12.6 cm previously. Small low-attenuation lesions in right and left hepatic lobe measuring 1-2 cm also appear new or increased since previous study. Bilateral adrenal masses show mild increase in size, with right adrenal mass measuring 3.7 cm compared to 2.7 cm previously and left adrenal mass measuring 3.3 cm compared to 2.6 cm previously. New mild abdominal retroperitoneal lymphadenopathy is seen, with largest lymph node measuring 1.5 cm in the aortocaval space on image 177/3. Chronic right renal atrophy noted. Bilateral renal cysts and nonobstructive renal calculi remains stable. No evidence of hydronephrosis. PET images were obtained through the pelvis, and no pelvic hypermetabolic soft tissue masses or lymphadenopathy are identified. SKELETON PET images through pelvis show multiple hypermetabolic bone metastases involving the sacrum, pelvis, and bilateral proximal femurs, which are not visualized on corresponding CT images. Index lesion in the sacrum has SUV max of 6.8. IMPRESSION: Noncontrast CT images were obtained from the neck through the upper thighs, however PET images were only obtained through the pelvis due to patient's severe pain and inability to complete the scan. Noncontrast CT images show mild progression of liver metastases, mild increased size of bilateral  adrenal masses, and new mild abdominal retroperitoneal lymphadenopathy, consistent with metastatic disease. Several sub-cm mediastinal lymph nodes also noted in left paratracheal region, which are not pathologically enlarged. Pelvic PET images show multiple hypermetabolic bone metastases in the pelvis and proximal femurs. These are not visible on corresponding CT images. Electronically Signed   By: Earle Gell M.D.   On: 05/31/2016 09:38   Dg Chest Port 1 View  Result Date: 06/07/2016 CLINICAL DATA:  Patient was found down in his home this morning by a friend. Patient states he thinks he fell around 1am this morning. Patient has coffee ground looking emesis today with increased weakness and low back pain since fall. Patient has a HX of lung cancer. EXAM: PORTABLE CHEST 1 VIEW COMPARISON:  PET-CT, 05/30/2016 FINDINGS: Cardiac silhouette is normal in size and configuration.  No mediastinal or hilar masses or evidence of adenopathy. Lungs are clear.  No pleural effusion or pneumothorax. Skeletal structures are demineralized but intact. IMPRESSION: No acute cardiopulmonary disease. Electronically Signed   By: Lajean Manes M.D.   On: 06/07/2016 15:59   Dg Abd 2 Views  Result Date: 06/07/2016 CLINICAL DATA:  Vomiting.  History of prostate and stomach cancer. EXAM: ABDOMEN - 2 VIEW COMPARISON:  CT of the abdomen 04/15/2016 FINDINGS: The bowel gas pattern is normal. There is no evidence of free air. The liver shadow is enlarged reaching the right iliac crest on supine images. There is an 11 mm and 5 mm calcific densities overlying the left renal shadow IMPRESSION: Abnormal appearance of the liver, which may be due to enlarging right lobe of the liver mass. Calcific densities overlying the left renal shadow, which likely represent renal calculi. Electronically Signed   By: Fidela Salisbury M.D.   On: 06/07/2016 17:28   Assessment:  The patient is a 75 y.o.  gentleman with widely metastatic poorly differentiated  neuroendocrine tumor.  Disease is progressing rapidly.  Performance status is poor.  He is unable to care for himself.  Plan:    1. Stage IV poorly differentiated neuroendocrine tumor: Given patient's declining performance status and may be difficult for him to handle any treatments. Hospice and comfort care was discussed, but he is not ready to make this decision. Appreciate palliative care input. 2. Renal insufficiency: Improved with IV fluids. 3. Hypercalcemia: Patient's calcium is now within normal limits. 4. Pain: Patient continues to have pain, continue current narcotic regimen and adjust as needed.  Appreciate consult, will follow.   Lloyd Huger, MD   06/10/2016 1:27 PM

## 2016-06-10 NOTE — Progress Notes (Signed)
Skin check.  Pt has yellow/green to purple bruise that is painful to left hip.  Does not appear to be a deep tissue injury.  Covered with foam and pt turned every 2 hours. Right hip is pink blanchable skin that also does not appear to be a deep tissue injury.  Covered with foam and pt turned every 2 hours.  Pt does nave a nonblanchable stage 1 to sacrum that is also covered in foam.  Dorna Bloom BSN, RN, [employer]

## 2016-06-11 ENCOUNTER — Ambulatory Visit: Payer: Medicare Other

## 2016-06-11 DIAGNOSIS — Z515 Encounter for palliative care: Secondary | ICD-10-CM

## 2016-06-11 MED ORDER — OXYCODONE HCL 5 MG PO TABS
15.0000 mg | ORAL_TABLET | ORAL | Status: DC | PRN
  Administered 2016-06-11: 15 mg via ORAL
  Filled 2016-06-11: qty 3

## 2016-06-11 NOTE — Progress Notes (Signed)
Patient discharged to [facility] per MD order. Will transport by EMS.

## 2016-06-11 NOTE — Discharge Instructions (Signed)
Regular diet. °Activity as tolerated. °

## 2016-06-11 NOTE — Progress Notes (Signed)
CSW spoke to patient's daughter- [name] over the phone. Discussed discharge plans. Per [name] she is agreeable for patient to go to [facility] in [place]. Granted CSW verbal permission to send referral to [facility]. CSW sent referral to [facility]. There are beds available at [facility] home today. Informed MD of above. CSW completed discharge packet and placed it on patient's chart.   Ernest Pine, MSW, LCSW, [place] Clinical Social Worker [phone]

## 2016-06-11 NOTE — Discharge Summary (Signed)
[facility] [place] NAME: Jonathon Benjamin    MR#:  [id number]  DATE OF BIRTH:  1941/06/11  DATE OF ADMISSION:  06/07/2016 ADMITTING PHYSICIAN: Hillary Bow, MD  DATE OF DISCHARGE: 06/11/2016  PRIMARY CARE PHYSICIAN: Sherrin Daisy, MD   ADMISSION DIAGNOSIS:  Dehydration [E86.0] Vomiting [R11.10] Abrasion, face without infection [S00.81XA] AKI (acute kidney injury) ([place]) [N17.9] Abrasion of left elbow, initial encounter [S50.312A] Sepsis, due to unspecified organism ([place]) [A41.9] Closed wedge compression fracture of eleventh thoracic vertebra, initial encounter ([place]) [id number]  DISCHARGE DIAGNOSIS:  Active Problems:   Dehydration   Pressure injury of skin   Palliative care encounter   DNR (do not resuscitate)   Goals of care, counseling/discussion   Cancer associated pain   SECONDARY DIAGNOSIS:   Past Medical History:  Diagnosis Date  . Hypertension   . Prostate cancer ([place])   . Skin cancer   . Stomach cancer ([place])   . Stroke [facility])      ADMITTING HISTORY  Jonathon Benjamin  is a 75 y.o. 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 Patient had some dark coffee grounds in his vomiting. Very weak.  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  His lumbar x-ray today shows mild T11 compression fractures. No neurological deficits.  Patient is a poor historian and history obtained from family at bedside and reviewing old records.  HOSPITAL COURSE:   74 year old male patient history of malignant neuroendocrine R carcinoma metastases to liver and bone. Patient has chronic abdominal pain, diarrhea, nausea.: Patient admitted because of fall, found to have acute renal failure, UTI, T11 compression fracture.   # Acute renal failure with ATN secondary to poor by  mouth intake  renal function is better.  Improved with IVF  # Slightly elevated troponins likely secondary to fall, renal failure. EF 65% . 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 Continue oxycodone.  # UTI with multiple organisms Change to cipro and finished course  # severe protein calorie malnutrition  # Neuroendocrine tumor with wide metastasis  Due to declining status he was not a candidate for chemo per Dr. Grayland Ormond. Patient has chosen to be transferred to Hospice home with DNR for end of life care. Palliative care team discussed with patient and daughter in detail.  CONSULTS OBTAINED:  Treatment Team:  Lequita Asal, MD  DRUG ALLERGIES:  No Known Allergies  DISCHARGE MEDICATIONS:   Current Discharge Medication List    CONTINUE these medications which have NOT CHANGED   Details  amLODipine (NORVASC) 5 MG tablet Take 5 mg by mouth daily.    Associated Diagnoses: Liver mass    benazepril (LOTENSIN) 20 MG tablet Take 20 mg by mouth daily.    Associated Diagnoses: Liver mass    citalopram (CELEXA) 20 MG tablet Take by mouth.   Associated Diagnoses: Liver mass    diphenoxylate-atropine (LOMOTIL) 2.5-0.025 MG tablet Take 1 tablet by mouth 4 (four) times daily as needed for diarrhea or loose stools. Qty: 30 tablet, Refills: 0   Associated Diagnoses: Liver mass    fentaNYL (DURAGESIC - DOSED MCG/HR) 25 MCG/HR patch Place 1 patch (25 mcg total) onto the skin every 3 (three) days. Qty: 10 patch, Refills: 0   Associated Diagnoses: Malignant poorly  differentiated neuroendocrine carcinoma (HCC)    Loratadine 10 MG CAPS Take 10 mg by mouth.    Associated Diagnoses: Liver mass    ondansetron (ZOFRAN) 8 MG tablet Take 1 tablet (8 mg total) by mouth every 8 (eight) hours as needed for nausea or vomiting. Qty: 30 tablet, Refills: 2   Associated Diagnoses: Liver mass    Oxycodone HCl  10 MG TABS Take 1-2 tablets (10-20 mg total) by mouth every 6 (six) hours as needed. Qty: 90 tablet, Refills: 0   Associated Diagnoses: Malignant poorly differentiated neuroendocrine carcinoma (HCC)    promethazine (PHENERGAN) 25 MG tablet Take 1 tablet (25 mg total) by mouth every 6 (six) hours as needed for nausea or vomiting. Qty: 30 tablet, Refills: 0      STOP taking these medications     aspirin EC 81 MG tablet      megestrol (MEGACE) 40 MG tablet      simvastatin (ZOCOR) 20 MG tablet         Today   VITAL SIGNS:  Blood pressure 124/67, pulse 69, temperature 98.1 F (36.7 C), temperature source Oral, resp. rate 18, height 5\' 5"  (1.651 m), weight 58.5 kg (129 lb), SpO2 98 %.  I/O:   Intake/Output Summary (Last 24 hours) at 06/11/16 1040 Last data filed at 06/10/16 1800  Gross per 24 hour  Intake              240 ml  Output                0 ml  Net              240 ml    PHYSICAL EXAMINATION:  Physical Exam  GENERAL:  75 y.o.-year-old patient lying in the bed with no acute distress.  LUNGS: Normal breath sounds bilaterally. CARDIOVASCULAR: S1, S2 normal. No murmurs, rubs, or gallops.  ABDOMEN: Soft, BS present. Mass palpable NEUROLOGIC: Moves all 4 extremities. PSYCHIATRIC: The patient is alert and awake. Flat affect SKIN: No obvious rash, lesion, or ulcer.   DATA REVIEW:   CBC  Recent Labs Lab 06/09/16 0447  WBC 12.1*  HGB 10.9*  HCT 33.1*  PLT 400    Chemistries   Recent Labs Lab 06/08/16 0251 06/09/16 0447  NA 138 137  K 4.8 4.2  CL 109 109  CO2 19* 19*  GLUCOSE 156* 133*  BUN 102* 73*  CREATININE 2.49* 1.56*  CALCIUM 9.8 9.8  AST 63*  --   ALT 78*  --   ALKPHOS 250*  --   BILITOT 0.8  --     Cardiac Enzymes  Recent Labs Lab 06/08/16 0251  TROPONINI 0.17*    Microbiology Results  Results for orders placed or performed during the hospital encounter of 06/07/16  Culture, blood (routine x 2)     Status: None (Preliminary  result)   Collection Time: 06/07/16  3:02 PM  Result Value Ref Range Status   Specimen Description BLOOD  Final   Special Requests NONE  Final   Culture NO GROWTH 4 DAYS  Final   Report Status PENDING  Incomplete  Culture, blood (routine x 2)     Status: None (Preliminary result)   Collection Time: 06/07/16  3:02 PM  Result Value Ref Range Status   Specimen Description BLOOD LEFT HAND  Final   Special Requests BOTTLES DRAWN AEROBIC AND ANAEROBIC 5CC  Final   Culture NO GROWTH 4 DAYS  Final   Report Status PENDING  Incomplete  Urine culture     Status: Abnormal   Collection Time: 06/07/16  4:31 PM  Result Value Ref Range Status   Specimen Description URINE, RANDOM  Final   Special Requests NONE  Final   Culture MULTIPLE SPECIES PRESENT, SUGGEST RECOLLECTION (A)  Final   Report Status 06/09/2016 FINAL  Final  C difficile quick scan w PCR reflex     Status: None   Collection Time: 06/09/16  3:48 PM  Result Value Ref Range Status   C Diff antigen NEGATIVE NEGATIVE Final   C Diff toxin NEGATIVE NEGATIVE Final   C Diff interpretation No C. difficile detected.  Final    RADIOLOGY:  No results found.  Follow up with PCP in 1 week.  Management plans discussed with the patient, family and they are in agreement.  CODE STATUS:     Code Status Orders        Start     Ordered   06/10/16 1728  Do not attempt resuscitation (DNR)  Continuous    Question Answer Comment  In the event of cardiac or respiratory ARREST Do not call a "code blue"   In the event of cardiac or respiratory ARREST Do not perform Intubation, CPR, defibrillation or ACLS   In the event of cardiac or respiratory ARREST Use medication by any route, position, wound care, and other measures to relive pain and suffering. May use oxygen, suction and manual treatment of airway obstruction as needed for comfort.      06/10/16 1728    Code Status History    Date Active Date Inactive Code Status Order ID Comments User  Context   06/07/2016  4:55 PM 06/10/2016  5:28 PM Full Code [id number]  Hillary Bow, MD ED      TOTAL TIME TAKING CARE OF THIS PATIENT ON DAY OF DISCHARGE: more than 30 minutes.   Hillary Bow R M.D on 06/11/2016 at 10:40 AM  Between 7am to 6pm - Pager - [phone]  After 6pm go to [email or web address] - password [email or web address] [facility]  Office  [phone]  CC: Primary care physician; Sherrin Daisy, MD  Note: This dictation was prepared with Dragon dictation along with smaller phrase technology. Any transcriptional errors that result from this process are unintentional.

## 2016-06-11 NOTE — Progress Notes (Signed)
New hospice home referral received from [facility]. Mr. Blazier is a 75 year old man with a known history of stage IV neuroendocrine cancer admitted to [facility] on 106 for treatment of dehydration and pain. His daughter [name] met with Palliative Medicine NP's yesterday and she has spoken to Mr. Garabedian's oncologist this morning, she has chosen to focus on his comfort with transition to the hospice home today. Writer met in the family room with [name] to initiate education regarding hospice services, philosophy and team approach to care with good understanding voiced. Questions answered, consents signed. [name] is an only child and is very emotional when discussion her father's decline. Support offered. Hopsice home SW made aware of need for support. Patient seen lying in bed, appeared to be sleeping. He has required 2 doses of PRN pin medication, oxycodone IR 10 mg at  6:43 am and another dose of oxycondone 1IR 15 mg at 9:00 am. He was able to take his oral medications this morning, he did not eat breakfast. Patient information faxed to hospice referral, report called to the hospice home. EMS notified for transport at 2:05 pm. Hospital care team and [facility] made aware. Signed DNR form in place in patient's discharge packet. Thank you . Flo Shanks RN, BSN, [place] [facility] [place], [facility] [phone] c

## 2016-06-11 NOTE — Progress Notes (Signed)
Daily Progress Note   Patient Name: Jonathon Benjamin       Date: 06/11/2016 DOB: 02/12/1941  Age: 75 y.o. MRN#: [id number] Attending Physician: Hillary Bow, MD Primary Care Physician: Sherrin Daisy, MD Admit Date: 06/07/2016  Reason for Consultation/Follow-up: Establishing goals of care, Hospice Evaluation and Pain control  Subjective: Woke patient upon arrival to room. Reoriented to room. Denies pain or discomfort.  Spoke with daughter, [name] this AM after her discussion with Dr. Grayland Ormond. She would like for her father to go to residential hospice. Social work consulted. Gone from my Sight book given.   Length of Stay: 4  Current Medications: Scheduled Meds:  . ciprofloxacin  250 mg Oral BID  . citalopram  10 mg Oral Daily  . feeding supplement (ENSURE ENLIVE)  237 mL Oral BID BM  . fentaNYL  50 mcg Transdermal Q72H  . megestrol  400 mg Oral Daily  . sodium chloride flush  3 mL Intravenous Q12H    Continuous Infusions:    PRN Meds: acetaminophen **OR** acetaminophen, albuterol, bisacodyl, diphenoxylate-atropine, ondansetron **OR** ondansetron (ZOFRAN) IV, oxyCODONE, polyethylene glycol  Physical Exam  Constitutional: He is easily aroused. He appears ill.  Cardiovascular: Regular rhythm and normal heart sounds.   Pulmonary/Chest: Effort normal. He has decreased breath sounds.  Abdominal: Soft. Bowel sounds are normal.  Neurological: He is easily aroused.  Forgetful, drowsy  Skin: Skin is warm and dry.  Psychiatric: His speech is delayed. He is inattentive.  Nursing note and vitals reviewed.           Vital Signs: BP 100/63 (BP Location: Left Arm)   Pulse 69   Temp 97.9 F (36.6 C) (Oral)   Resp 16   Ht 5\' 5"  (1.651 m)   Wt 58.5 kg (129 lb)   SpO2 95%   BMI  21.47 kg/m  SpO2: SpO2: 95 % O2 Device: O2 Device: Not Delivered O2 Flow Rate:    Intake/output summary:   Intake/Output Summary (Last 24 hours) at 06/11/16 1502 Last data filed at 06/10/16 1800  Gross per 24 hour  Intake                0 ml  Output                0 ml  Net  0 ml   LBM: Last BM Date: 06/10/16 Baseline Weight: Weight: 60.6 kg (133 lb 8 oz) Most recent weight: Weight: 58.5 kg (129 lb)       Palliative Assessment/Data: PPS 20%   Flowsheet Rows   Flowsheet Row Most Recent Value  Intake Tab  Referral Department  Hospitalist  Unit at Time of Referral  Oncology Unit  Palliative Care Primary Diagnosis  Cancer  Date Notified  06/09/16  Palliative Care Type  New Palliative care  Reason for referral  Clarify Goals of Care  Date of Admission  06/07/16  Date first seen by Palliative Care  06/10/16  # of days Palliative referral response time  1 Day(s)  # of days IP prior to Palliative referral  2  Clinical Assessment  Palliative Performance Scale Score  20%  Psychosocial & Spiritual Assessment  Palliative Care Outcomes  Patient/Family meeting held?  Yes  Who was at the meeting?  [place] goals of care, Counseled regarding hospice, Improved pain interventions, Changed CPR status      Patient Active Problem List   Diagnosis Date Noted  . Palliative care encounter   . DNR (do not resuscitate)   . Goals of care, counseling/discussion   . Cancer associated pain   . Pressure injury of skin 06/08/2016  . Dehydration 06/07/2016  . 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 Liver mass 04/22/2016    Palliative Care Assessment & Plan   Patient Profile: 75 y.o. male with past medical history of hypertension, stroke, and recent diagnosis of metastatic poorly differentiated neuroendocrine tumor admitted on 06/07/2016 after patient was found down at home by a friend. Abdomen  and pelvic CTscan on 04/15/2016 revealed a 14 cm mass invading the gallbladder as well as a 7 cm right liver mass, and bilateral adrenal metastasis. CT-guided liver biopsy on 05/07/2016 showing carcinoma with neuroendocrine differentiation. PET scan unable to be completed due to pain. Seen by Dr. Grayland Ormond and on fentanyl patch 19mcg and oxycodone 10mg  q6h prn. Upon admission, diagnosed with acute renal failure, UTI, and T11 compressin fracture. Renal function has improved but overall poor prognosis with poor appetite and functional decline. Palliative Medicine consultation for goals of care.    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  Comfort measures--discontinue medications and nursing interventions that do not provide comfort.   Code Status:   DNR   Code Status Orders        Start     Ordered   06/10/16 1728  Do not attempt resuscitation (DNR)  Continuous    Question Answer Comment  In the event of cardiac or respiratory ARREST Do not call a "code blue"   In the event of cardiac or respiratory ARREST Do not perform Intubation, CPR, defibrillation or ACLS   In the event of cardiac or respiratory ARREST Use medication by any route, position, wound care, and other measures to relive pain and suffering. May use oxygen, suction and manual treatment of airway obstruction as needed for comfort.      06/10/16 1728    Code Status History    Date Active Date Inactive Code Status Order ID Comments User Context   06/07/2016  4:55 PM 06/10/2016  5:28 PM Full Code [id number]  Hillary Bow, MD ED  Prognosis:   < 2 weeks in the setting of metastatic cancer with severe nutritional and functional status decline.   Discharge  Planning:  Hospice facility  Care plan was discussed with patient, RN, daughter, Dr. Darvin Neighbours, and Wadie Lessen, NP  Thank you for allowing the Palliative Medicine Team to assist in the care of this patient.   Time In: 1230 Time Out: 1245 Total Time 69min Prolonged Time Billed  no       Greater than 50%  of this time was spent counseling and coordinating care related to the above assessment and plan.  Ihor Dow, FNP-C Palliative Medicine Team  Phone: [phone] Fax: [phone]  Please contact Palliative Medicine Team phone at [phone] for questions and concerns.

## 2016-06-11 NOTE — Progress Notes (Signed)
Pt woke up and complained of pain.  Pt frustrated with pain and wants to leave.  Discussed with pt about prn medication and the need to request pain medication more frequently.  Pt states " it doesn't help, so why take it"? Explained again that he needs to have regular doses of pain medication and not wait until pain is unbearable before asking for medication.  Pt is not receptive to instruction. Wrote on board next time pain medication can be taken. Dorna Bloom RN

## 2016-06-12 ENCOUNTER — Inpatient Hospital Stay: Payer: Medicare Other

## 2016-06-12 ENCOUNTER — Inpatient Hospital Stay: Payer: Medicare Other | Admitting: Oncology

## 2016-06-12 LAB — CULTURE, BLOOD (ROUTINE X 2)
Culture: NO GROWTH
Culture: NO GROWTH

## 2016-06-13 ENCOUNTER — Inpatient Hospital Stay: Payer: Medicare Other

## 2016-06-13 ENCOUNTER — Other Ambulatory Visit: Payer: Medicare Other

## 2016-06-14 ENCOUNTER — Inpatient Hospital Stay: Payer: Medicare Other

## 2016-06-17 ENCOUNTER — Telehealth: Payer: Self-pay | Admitting: *Deleted

## 2016-06-17 ENCOUNTER — Ambulatory Visit: Payer: Medicare Other

## 2016-06-17 ENCOUNTER — Other Ambulatory Visit: Payer: Medicare Other

## 2016-06-17 ENCOUNTER — Ambulatory Visit: Payer: Medicare Other | Admitting: Oncology

## 2016-06-17 NOTE — Telephone Encounter (Signed)
Called to report that patient expired 2016/06/25 @4 :35 AM

## 2016-06-18 ENCOUNTER — Ambulatory Visit: Payer: Medicare Other

## 2016-06-19 ENCOUNTER — Ambulatory Visit: Payer: Medicare Other

## 2016-07-03 DEATH — deceased

## 2016-08-10 ENCOUNTER — Other Ambulatory Visit: Payer: Self-pay | Admitting: Nurse Practitioner

## 2017-04-16 IMAGING — PT NM PET TUM IMG INITIAL (PI) SKULL BASE T - THIGH
1 of 10 series · 1 of 25 positions shown · non-contrast
Comparison: AP CT on 04/15/2016

CLINICAL DATA: Initial treatment strategy for malignant
neuroendocrine carcinoma in liver.

EXAM:
NUCLEAR MEDICINE PET SKULL BASE TO THIGH
TECHNIQUE: 12.8 mCi F-18 FDG was injected intravenously. CT data was
successfully obtained and used for attenuation correction and
anatomic localization from the skullbase through the upper thighs.
Full-ring PET imaging was performed beginning at the pelvis. PET
images were obtained through the pelvis, however the patient was in
severe pain and could not complete the scan. PET images were not
obtained through the neck, chest, or abdomen
FASTING BLOOD GLUCOSE:  Value: 187 mg/dl

[Series 3: ct wb 5.0 b30f · axial · 5.0mm · 0.98mm/px · 1 of 290 slices shown]
[im 290/290  brain]
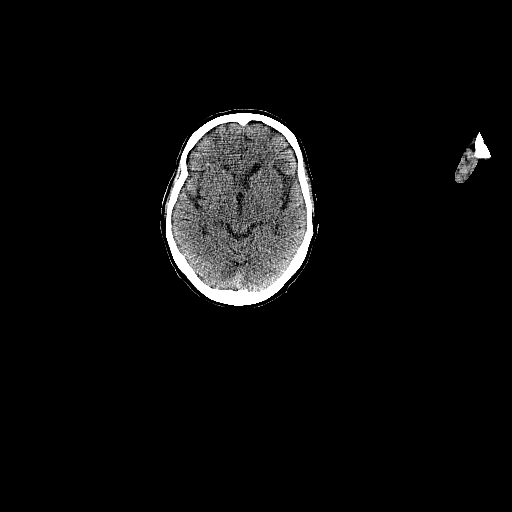

[1 of 25 positions shown; findings below may reference images not displayed]

FINDINGS: NECK

No PET images were obtained through the neck, as described above.
Noncontrast CT images through the neck show no evidence of masses or
lymphadenopathy.

CHEST

No PET images were obtained through the chest, as described above.
Noncontrast CT images through the chest show several sub-cm
mediastinal lymph nodes in the left paratracheal region, largest
measuring 7 mm on image 87/3. No pathologically enlarged lymph nodes
identified on this unenhanced exam. Aortic atherosclerosis and
coronary artery calcification noted.

Moderate emphysema is noted. No suspicious pulmonary nodules or
masses identified. No evidence of pulmonary infiltrate or pleural
effusion.

ABDOMEN/PELVIS

No PET images were obtained through the abdomen, as described above.
Noncontrast CT images through the abdomen show several
low-attenuation liver masses, largest involving both the right and
left hepatic lobes which measures approximately 14.9 x 14.4 cm on
image 173/3 compared to 13.5 by 12.6 cm previously. Small
low-attenuation lesions in right and left hepatic lobe measuring 1-2
cm also appear new or increased since previous study.

Bilateral adrenal masses show mild increase in size, with right
adrenal mass measuring 3.7 cm compared to 2.7 cm previously and left
adrenal mass measuring 3.3 cm compared to 2.6 cm previously. New
mild abdominal retroperitoneal lymphadenopathy is seen, with largest
lymph node measuring 1.5 cm in the aortocaval space on image 177/3.
Chronic right renal atrophy noted. Bilateral renal cysts and
nonobstructive renal calculi remains stable. No evidence of
hydronephrosis.

PET images were obtained through the pelvis, and no pelvic
hypermetabolic soft tissue masses or lymphadenopathy are identified.

SKELETON

PET images through pelvis show multiple hypermetabolic bone
metastases involving the sacrum, pelvis, and bilateral proximal
femurs, which are not visualized on corresponding CT images. Index
lesion in the sacrum has SUV max of 6.8.
IMPRESSION: Noncontrast CT images were obtained from the neck through the upper
thighs, however PET images were only obtained through the pelvis due
to patient's severe pain and inability to complete the scan.

Noncontrast CT images show mild progression of liver metastases,
mild increased size of bilateral adrenal masses, and new mild
abdominal retroperitoneal lymphadenopathy, consistent with
metastatic disease. Several sub-cm mediastinal lymph nodes also
noted in left paratracheal region, which are not pathologically
enlarged.

Pelvic PET images show multiple hypermetabolic bone metastases in
the pelvis and proximal femurs. These are not visible on
corresponding CT images.
